# Patient Record
Sex: Female | Born: 1980
Health system: Southern US, Community
[De-identification: ages and names within clinical notes are randomized; demographics above are authoritative.]

## PROBLEM LIST (undated history)

## (undated) DIAGNOSIS — F419 Anxiety disorder, unspecified: Secondary | ICD-10-CM

## (undated) DIAGNOSIS — E282 Polycystic ovarian syndrome: Secondary | ICD-10-CM

## (undated) DIAGNOSIS — G4730 Sleep apnea, unspecified: Secondary | ICD-10-CM

## (undated) DIAGNOSIS — K219 Gastro-esophageal reflux disease without esophagitis: Secondary | ICD-10-CM

## (undated) DIAGNOSIS — F329 Major depressive disorder, single episode, unspecified: Secondary | ICD-10-CM

## (undated) DIAGNOSIS — R7301 Impaired fasting glucose: Secondary | ICD-10-CM

## (undated) DIAGNOSIS — E559 Vitamin D deficiency, unspecified: Secondary | ICD-10-CM

## (undated) DIAGNOSIS — K5900 Constipation, unspecified: Secondary | ICD-10-CM

## (undated) DIAGNOSIS — F32A Depression, unspecified: Secondary | ICD-10-CM

## (undated) DIAGNOSIS — K589 Irritable bowel syndrome without diarrhea: Secondary | ICD-10-CM

## (undated) DIAGNOSIS — R7303 Prediabetes: Secondary | ICD-10-CM

## (undated) HISTORY — DX: Polycystic ovarian syndrome: E28.2

## (undated) HISTORY — DX: Prediabetes: R73.03

## (undated) HISTORY — PX: NOSE SURGERY: SHX723

## (undated) HISTORY — PX: OTHER SURGICAL HISTORY: SHX169

## (undated) HISTORY — DX: Vitamin D deficiency, unspecified: E55.9

## (undated) HISTORY — DX: Major depressive disorder, single episode, unspecified: F32.9

## (undated) HISTORY — DX: Anxiety disorder, unspecified: F41.9

## (undated) HISTORY — DX: Sleep apnea, unspecified: G47.30

## (undated) HISTORY — DX: Irritable bowel syndrome, unspecified: K58.9

## (undated) HISTORY — DX: Constipation, unspecified: K59.00

## (undated) HISTORY — DX: Depression, unspecified: F32.A

## (undated) HISTORY — DX: Impaired fasting glucose: R73.01

---

## 2001-03-07 ENCOUNTER — Encounter: Payer: Self-pay | Admitting: Family Medicine

## 2005-11-22 ENCOUNTER — Other Ambulatory Visit: Admission: RE | Admit: 2005-11-22 | Discharge: 2005-11-22 | Payer: Self-pay | Admitting: Obstetrics & Gynecology

## 2008-10-16 ENCOUNTER — Ambulatory Visit: Payer: Self-pay | Admitting: Family Medicine

## 2008-12-12 ENCOUNTER — Ambulatory Visit: Payer: Self-pay | Admitting: Family Medicine

## 2009-05-25 ENCOUNTER — Ambulatory Visit: Payer: Self-pay | Admitting: Family Medicine

## 2009-05-25 DIAGNOSIS — K589 Irritable bowel syndrome without diarrhea: Secondary | ICD-10-CM | POA: Insufficient documentation

## 2009-06-26 ENCOUNTER — Ambulatory Visit: Payer: Self-pay | Admitting: Family Medicine

## 2009-06-26 DIAGNOSIS — F988 Other specified behavioral and emotional disorders with onset usually occurring in childhood and adolescence: Secondary | ICD-10-CM | POA: Insufficient documentation

## 2009-07-02 ENCOUNTER — Telehealth: Payer: Self-pay | Admitting: Family Medicine

## 2009-07-16 ENCOUNTER — Telehealth (INDEPENDENT_AMBULATORY_CARE_PROVIDER_SITE_OTHER): Payer: Self-pay | Admitting: *Deleted

## 2009-07-20 ENCOUNTER — Ambulatory Visit: Payer: Self-pay | Admitting: Family Medicine

## 2009-07-20 DIAGNOSIS — M546 Pain in thoracic spine: Secondary | ICD-10-CM | POA: Insufficient documentation

## 2009-07-20 DIAGNOSIS — R7301 Impaired fasting glucose: Secondary | ICD-10-CM | POA: Insufficient documentation

## 2009-09-04 ENCOUNTER — Ambulatory Visit: Payer: Self-pay | Admitting: Family Medicine

## 2009-09-04 ENCOUNTER — Telehealth: Payer: Self-pay | Admitting: Family Medicine

## 2009-09-07 ENCOUNTER — Telehealth (INDEPENDENT_AMBULATORY_CARE_PROVIDER_SITE_OTHER): Payer: Self-pay | Admitting: *Deleted

## 2009-09-10 ENCOUNTER — Encounter: Payer: Self-pay | Admitting: Family Medicine

## 2009-09-10 ENCOUNTER — Telehealth (INDEPENDENT_AMBULATORY_CARE_PROVIDER_SITE_OTHER): Payer: Self-pay | Admitting: *Deleted

## 2009-09-17 ENCOUNTER — Ambulatory Visit: Payer: Self-pay | Admitting: Family Medicine

## 2009-10-22 ENCOUNTER — Encounter (INDEPENDENT_AMBULATORY_CARE_PROVIDER_SITE_OTHER): Payer: Self-pay | Admitting: *Deleted

## 2009-10-22 ENCOUNTER — Ambulatory Visit: Payer: Self-pay | Admitting: Family Medicine

## 2009-10-22 DIAGNOSIS — F341 Dysthymic disorder: Secondary | ICD-10-CM | POA: Insufficient documentation

## 2009-10-27 ENCOUNTER — Telehealth (INDEPENDENT_AMBULATORY_CARE_PROVIDER_SITE_OTHER): Payer: Self-pay | Admitting: *Deleted

## 2010-02-03 ENCOUNTER — Ambulatory Visit: Payer: Self-pay | Admitting: Family Medicine

## 2010-02-03 ENCOUNTER — Encounter: Admission: RE | Admit: 2010-02-03 | Discharge: 2010-02-03 | Payer: Self-pay | Admitting: Family Medicine

## 2010-02-03 DIAGNOSIS — M25569 Pain in unspecified knee: Secondary | ICD-10-CM | POA: Insufficient documentation

## 2010-03-15 ENCOUNTER — Ambulatory Visit: Payer: Self-pay | Admitting: Family Medicine

## 2010-03-15 DIAGNOSIS — J029 Acute pharyngitis, unspecified: Secondary | ICD-10-CM | POA: Insufficient documentation
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
## 2010-11-30 NOTE — Assessment & Plan Note (Signed)
Summary: exudative pharyngitis   Vital Signs:  Patient profile:   30 year old female Height:      64 inches Weight:      182 pounds BMI:     31.35 O2 Sat:      100 % on Room air Temp:     97.Taylor degrees F oral Pulse rate:   116 / minute BP sitting:   130 / 80  (left arm) Cuff size:   large  Vitals Entered By: Payton Spark CMA (Mar 15, 2010 2:59 PM)  O2 Flow:  Room air CC: ST x 3 days.   Primary Care Provider:  Seymour Bars DO  CC:  ST x 3 days.Marland Kitchen  History of Present Illness: 30 yo [name] presents for sore throat and bodyaches that started 3 days ago with HA and postnasal drip.  She has a dry cough.  No rash.  Has had a fever at home.  Taking Tylenol.   Feels really tired.    No known exposure to strep or mono.  Using chlorasceptic. Denies hoarsness, nausea or vomitting.  Able to swallow water and saliva, with pain.    Current Medications (verified): 1)  Flintstones Complete  Chew (Pediatric Multivit-Minerals-C) .Marland Kitchen.. 1 Tab By Mouth Once Daily 2)  Seasonale 0.15-0.03 Mg Tabs (Levonorgest-Eth Estrad 91-Day) .... Take 1 Tablet By Mouth Once A Day 3)  D 1000 Plus Aloe  Tabs (Fa-Cyanocobalamin-B6-D-Ca-Aloe) .... Take 1 Tablet By Mouth Once A Day 4)  Bentyl 20 Mg Tabs (Dicyclomine Hcl) .Marland Kitchen.. 1 Tab By Mouth Qid As Needed Abdominal Spasm 5)  Clonazepam 1 Mg Tabs (Clonazepam) .Marland Kitchen.. 1 Tab By Mouth At Bedtime As Needed Anxiety  Allergies (verified): 1)  ! Erythromycin Base (Erythromycin Base)  Past History:  Past Medical History: Reviewed history from 10/22/2009 and no changes required. Impaired fasting glucose depression/ anxiety  Social History: Reviewed history from 05/25/2009 and no changes required. Divorced, lives with boyfriend.  no kids. 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  Review of Systems      See HPI  Physical Exam  General:  alert, well-developed, well-nourished, well-hydrated, and overweight-appearing.    Head:  normocephalic and atraumatic.  sinuses NTTP Eyes:  conjunctiva clear Ears:  EACs patent; TMs translucent and gray with good cone of light and bony landmarks.  Nose:  no nasal discharge.   Mouth:  good dentition, no gingival abnormalities, and pharynx pink and moist.  o/p injected.  voice slightly muffled.  R exudate.  1+ tonsilar hypertrophy Neck:  shotty tender submandibular LA Lungs:  Normal respiratory effort, chest expands symmetrically. Lungs are clear to auscultation, no crackles or wheezes. Heart:  no murmur and tachycardia.   Skin:  color normal and no rashes.     Impression & Recommendations:  Problem # 1:  EXUDATIVE PHARYNGITIS (ICD-462)  Rapid strep negative today but clinically, she appears to have strep (or possibly EBV).  Will treat with Amoxicillin + supportive care.  Call if rash develops.   Her updated medication list for this problem includes:    Amoxicillin 500 Mg Caps (Amoxicillin) .Marland Kitchen... 1 capsule by mouth three times a day x 7 days  Orders: Rapid Strep ([address])  Complete Medication List: 1)  Flintstones Complete Chew (Pediatric multivit-minerals-c) .Marland Kitchen.. 1 tab by mouth once daily 2)  Seasonale 0.15-0.03 Mg Tabs (Levonorgest-eth estrad 91-day) .... Take 1 tablet by mouth once a day 3)  D 1000 Plus Aloe Tabs (Fa-cyanocobalamin-b6-d-ca-aloe) .... Take 1  tablet by mouth once a day 4)  Bentyl 20 Mg Tabs (Dicyclomine hcl) .Marland Kitchen.. 1 tab by mouth qid as needed abdominal spasm 5)  Clonazepam 1 Mg Tabs (Clonazepam) .Marland Kitchen.. 1 tab by mouth at bedtime as needed anxiety 6)  Amoxicillin 500 Mg Caps (Amoxicillin) .Marland Kitchen.. 1 capsule by mouth three times a day x 7 days  Patient Instructions: 1)  Take Amoxicillin x 7 days for presumed strep throat. 2)  Use throat lozenges and ibuprofen for comfort. 3)  Clear liquids, rest. 4)  Call if not better in 1 wk or rash develops. Prescriptions: AMOXICILLIN 500 MG CAPS (AMOXICILLIN) 1 capsule by mouth three times a day x 7 days  #21 x 0    Entered and Authorized by:   Seymour Bars DO   Signed by:   Seymour Bars DO on 03/15/2010   Method used:   [facility]        [facility] [phone]* (retail)       [address] [place], [place]  [address]       Ph: [phone] or [phone]       Fax: [phone]   RxID:   [id number]   Laboratory Results    Other Tests  Rapid Strep: negative

## 2011-01-17 ENCOUNTER — Encounter: Payer: Self-pay | Admitting: Family Medicine

## 2011-01-19 ENCOUNTER — Ambulatory Visit (INDEPENDENT_AMBULATORY_CARE_PROVIDER_SITE_OTHER): Payer: PRIVATE HEALTH INSURANCE | Admitting: Family Medicine

## 2011-01-19 ENCOUNTER — Encounter: Payer: Self-pay | Admitting: Family Medicine

## 2011-01-19 DIAGNOSIS — F988 Other specified behavioral and emotional disorders with onset usually occurring in childhood and adolescence: Secondary | ICD-10-CM

## 2011-01-19 DIAGNOSIS — K589 Irritable bowel syndrome without diarrhea: Secondary | ICD-10-CM

## 2011-01-19 DIAGNOSIS — F411 Generalized anxiety disorder: Secondary | ICD-10-CM

## 2011-01-19 MED ORDER — DICYCLOMINE HCL 20 MG PO TABS: 20.0000 mg | ORAL_TABLET | Freq: Four times a day (QID) | ORAL | Status: DC | PRN

## 2011-01-19 MED ORDER — LISDEXAMFETAMINE DIMESYLATE 20 MG PO CAPS: 20.0000 mg | ORAL_CAPSULE | ORAL | Status: DC

## 2011-01-19 MED ORDER — CLONAZEPAM 1 MG PO TABS: 1.0000 mg | ORAL_TABLET | Freq: Every evening | ORAL | Status: DC | PRN

## 2011-01-19 NOTE — Assessment & Plan Note (Signed)
She is happy with the effectiveness of Vyvanse which she is using only on the days that she has to study for tests.  No palpations, insomnia, palpitations or anorexia.  She is due for a RD.

## 2011-01-19 NOTE — Progress Notes (Signed)
  Subjective:    Patient ID: Taylor [name], female    DOB: [birth year], 30 y.o.   MRN: [id number]  HPI30 yo WF presents for f/u ADD.  She is doing well on Vyvanse 20 mg once daily.  She only needs them on the days that she has to study for exams.  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  She denies palpitations, tremors, insomnia or anorexia.  She is on Seasonale for birth control.  She needs RFs of Bentyl and Klonopin.     Review of Systems  As per HPI.     Objective:   Physical Exam  Constitutional: She appears well-developed and well-nourished. No distress.       obese  HENT:  Head: Normocephalic and atraumatic.  Neck: Normal range of motion. Neck supple. No thyromegaly present.  Cardiovascular: Normal rate, regular rhythm and normal heart sounds.   No murmur heard. Pulmonary/Chest: Effort normal and breath sounds normal. No respiratory distress.  Neurological:       No tremor          Assessment & Plan:

## 2011-01-19 NOTE — Patient Instructions (Signed)
Stay on Vyvanse for ADD.  Return for follow up in 6 months.

## 2011-09-09 ENCOUNTER — Other Ambulatory Visit: Payer: Self-pay | Admitting: *Deleted

## 2011-09-09 DIAGNOSIS — F411 Generalized anxiety disorder: Secondary | ICD-10-CM

## 2011-09-09 MED ORDER — CLONAZEPAM 1 MG PO TABS: 1.0000 mg | ORAL_TABLET | Freq: Every evening | ORAL | Status: DC | PRN

## 2011-09-19 ENCOUNTER — Encounter: Payer: Self-pay | Admitting: Family Medicine

## 2011-09-19 ENCOUNTER — Ambulatory Visit (INDEPENDENT_AMBULATORY_CARE_PROVIDER_SITE_OTHER): Payer: PRIVATE HEALTH INSURANCE | Admitting: Family Medicine

## 2011-09-19 VITALS — BP 113/65 | HR 92 | Wt 197.0 lb

## 2011-09-19 DIAGNOSIS — F411 Generalized anxiety disorder: Secondary | ICD-10-CM

## 2011-09-19 MED ORDER — SERTRALINE HCL 50 MG PO TABS: 50.0000 mg | ORAL_TABLET | Freq: Every day | ORAL | Status: DC

## 2011-09-19 MED ORDER — CLONAZEPAM 1 MG PO TABS: 1.0000 mg | ORAL_TABLET | Freq: Every evening | ORAL | Status: DC | PRN

## 2011-09-19 NOTE — Progress Notes (Signed)
  Subjective:    Patient ID: Taylor [name], female    DOB: [birth year], 30 y.o.   MRN: [id number]  HPI She has had a family crisis about 3 weeks ago. She is Consulting civil engineer at [employer] and started seeing a therapist throught school and is starting biofeedbck.  Just found out father was cheating on her mom for the past 20 years.  Has taken zoloft, celexa, and cymbalta.  She thinks did ok with zoloft. Also really liked the cymbalta but can't afford it. She is also in school and finals are coming up so this has been stressful as well    Review of Systems     Objective:   Physical Exam  Constitutional: She is oriented to person, place, and time. She appears well-developed and well-nourished.  HENT:  Head: Normocephalic and atraumatic.  Neurological: She is alert and oriented to person, place, and time.  Skin: Skin is warm and dry.  Psychiatric: She has a normal mood and affect. Her behavior is normal.          Assessment & Plan:  GAD- 7 score of 17 today. Also some acute situational depression. Will restart the zoloft and f/u in 4 weeks. Has done well on it in the past. Half a tab dialy for 1-2 weeks then increase to whole tab. Continue counseling. Call if any concerns with the medication. Also refilled her xanax. We had sent 10 days ago but pharm says they never got it.

## 2011-10-13 ENCOUNTER — Encounter: Payer: Self-pay | Admitting: Family Medicine

## 2011-10-17 ENCOUNTER — Encounter: Payer: Self-pay | Admitting: Family Medicine

## 2011-10-17 ENCOUNTER — Ambulatory Visit (INDEPENDENT_AMBULATORY_CARE_PROVIDER_SITE_OTHER): Payer: PRIVATE HEALTH INSURANCE | Admitting: Family Medicine

## 2011-10-17 VITALS — BP 114/73 | HR 89 | Wt 197.0 lb

## 2011-10-17 DIAGNOSIS — F419 Anxiety disorder, unspecified: Secondary | ICD-10-CM

## 2011-10-17 DIAGNOSIS — F411 Generalized anxiety disorder: Secondary | ICD-10-CM

## 2011-10-17 MED ORDER — SERTRALINE HCL 50 MG PO TABS: 50.0000 mg | ORAL_TABLET | Freq: Every day | ORAL | Status: DC

## 2011-10-17 NOTE — Progress Notes (Signed)
  Subjective:    Patient ID: Taylor [name], female    DOB: 04-15-1981, 30 y.o.   MRN: [id number]  HPI F/U anxiety. 4 weeks started sertraline 50mg  and was getting HA and nausea so she cut in in half.  Feel her anxiety is much better.  She moved it to bedtime and says it is working some better but taking at bedtime versus in the morning. She is doing biofeedback as well. Her first appointment was last week.   Review of Systems     Objective:   Physical Exam  Constitutional: She is oriented to person, place, and time. She appears well-developed and well-nourished.  HENT:  Head: Normocephalic and atraumatic.  Cardiovascular: Normal rate, regular rhythm and normal heart sounds.   Pulmonary/Chest: Effort normal and breath sounds normal.  Neurological: She is alert and oriented to person, place, and time.  Skin: Skin is warm and dry.  Psychiatric: She has a normal mood and affect. Her behavior is normal.          Assessment & Plan:  Anxiety - GAD-7 score of 13 today, down from 17.  See seeing psychology Gertha Calkin. Work on inc dose to 50mg  and f/u in 6 weeks.  If she does not tolerate 50 mg a day Bactrim and 25 until I see her back in 6 weeks. I do think that she would have a more therapeutic effect at the 50 mg dose. She is still very happy with the progress she has made.

## 2011-11-28 ENCOUNTER — Ambulatory Visit: Payer: PRIVATE HEALTH INSURANCE | Admitting: Family Medicine

## 2011-12-01 ENCOUNTER — Ambulatory Visit: Payer: PRIVATE HEALTH INSURANCE | Admitting: Family Medicine

## 2011-12-02 ENCOUNTER — Ambulatory Visit: Payer: PRIVATE HEALTH INSURANCE | Admitting: Family Medicine

## 2011-12-02 ENCOUNTER — Encounter: Payer: Self-pay | Admitting: Family Medicine

## 2011-12-02 ENCOUNTER — Ambulatory Visit (INDEPENDENT_AMBULATORY_CARE_PROVIDER_SITE_OTHER): Payer: PRIVATE HEALTH INSURANCE | Admitting: Family Medicine

## 2011-12-02 VITALS — BP 123/73 | HR 91 | Temp 98.3°F | Wt 202.0 lb

## 2011-12-02 DIAGNOSIS — G4700 Insomnia, unspecified: Secondary | ICD-10-CM

## 2011-12-02 DIAGNOSIS — Z23 Encounter for immunization: Secondary | ICD-10-CM

## 2011-12-02 DIAGNOSIS — F341 Dysthymic disorder: Secondary | ICD-10-CM

## 2011-12-02 DIAGNOSIS — F32A Depression, unspecified: Secondary | ICD-10-CM

## 2011-12-02 DIAGNOSIS — F329 Major depressive disorder, single episode, unspecified: Secondary | ICD-10-CM

## 2011-12-02 MED ORDER — ZOLPIDEM TARTRATE 10 MG PO TABS: 10.0000 mg | ORAL_TABLET | Freq: Every evening | ORAL | Status: AC | PRN

## 2011-12-02 NOTE — Progress Notes (Signed)
  Subjective:    Patient ID: Taylor [name], female    DOB: 11/24/1980, 31 y.o.   MRN: [id number]  HPI  Anxiety/Depression - doing well on the higher 50mg  dose.  Still feels exhausted but felt that way before started the med. Feels it is really helping her. Happy with current does. Denis any SE.  Felt her sleep improved initially but now back to the same.   Hx of long term insomnia. She says she often has trouble initiating and maintaining sleep. Often times she will wake up at night and a complete hours for her to go back to sleep. She says very rarely will she asked to have a good night sleep. Says this is not better even with her anxiety under better control. Says feels tired all the time. No caffeine after lunch.  Bedroom is dark, cool, quiet. No TV in bedroom.  Has tried unisom, melatonin, and some OTc Meds. None of these really work.  Has used ambien in the past and it was helpful but sometime made her feel groggy. No alleviating or exacerbating sxs.   Review of Systems     Objective:   Physical Exam  Constitutional: She is oriented to person, place, and time. She appears well-developed and well-nourished.  HENT:  Head: Normocephalic and atraumatic.  Cardiovascular: Normal rate, regular rhythm and normal heart sounds.   Pulmonary/Chest: Effort normal and breath sounds normal.  Neurological: She is alert and oriented to person, place, and time.  Skin: Skin is warm and dry.  Psychiatric: She has a normal mood and affect. Her behavior is normal.          Assessment & Plan:  Anxiety/depression -GAD- 7 score of 1.  Takes med at night. Doing really well.  Continue regimen. F/U in 3 months.   Insomnia -New problems.  She plans on starting exercise program whic should help.  Can consider a sleep aid.  We discussed retrying the Ambien since she has used this in the past. Certainly she could split it in half and see if it improves her sleep quality but doesn't cause excess sedation in the  morning. She says she will try that. I also warned that this should really be used for short-term use, x 2 weeks,  and to try to wean as quickly as possible and use it as needed.  Tdap given today.

## 2011-12-10 ENCOUNTER — Other Ambulatory Visit: Payer: Self-pay | Admitting: Family Medicine

## 2012-03-12 ENCOUNTER — Encounter: Payer: Self-pay | Admitting: Family Medicine

## 2012-03-12 ENCOUNTER — Ambulatory Visit (INDEPENDENT_AMBULATORY_CARE_PROVIDER_SITE_OTHER): Payer: PRIVATE HEALTH INSURANCE | Admitting: Family Medicine

## 2012-03-12 VITALS — BP 128/81 | HR 65 | Ht 64.0 in | Wt 196.0 lb

## 2012-03-12 DIAGNOSIS — F17200 Nicotine dependence, unspecified, uncomplicated: Secondary | ICD-10-CM

## 2012-03-12 DIAGNOSIS — R5381 Other malaise: Secondary | ICD-10-CM

## 2012-03-12 DIAGNOSIS — F418 Other specified anxiety disorders: Secondary | ICD-10-CM

## 2012-03-12 DIAGNOSIS — E611 Iron deficiency: Secondary | ICD-10-CM | POA: Insufficient documentation

## 2012-03-12 DIAGNOSIS — F341 Dysthymic disorder: Secondary | ICD-10-CM

## 2012-03-12 DIAGNOSIS — R5383 Other fatigue: Secondary | ICD-10-CM

## 2012-03-12 DIAGNOSIS — Z720 Tobacco use: Secondary | ICD-10-CM

## 2012-03-12 MED ORDER — SERTRALINE HCL 100 MG PO TABS: 100.0000 mg | ORAL_TABLET | Freq: Every day | ORAL | Status: DC

## 2012-03-12 NOTE — Progress Notes (Signed)
  Subjective:    Patient ID: Taylor [name], female    DOB: 02-Nov-1980, 31 y.o.   MRN: [id number]  HPI 1 months of worsening anxiety and depression. Feels tired all the time and feels like she could fall alseep anywhere.  However, she has dry mouth.  Working out 2x /week on treadmill. Diet varies.  Appetite up and down.  Smoking 1/2ppd, somedays doesn't smoke at all. Drinking 2 glasses of wine 2 days per week. No CP or SOB.  No suicidal ideation or hallucinations. Occ racing thoughts.  Has had exams over the last 2 weeks. Uses her klonopin once a week on average. Sleep quality if fair. She says she feels very tired, similar to right before she started the medication. She has a good support system.     Review of Systems     Objective:   Physical Exam  Constitutional: She is oriented to person, place, and time. She appears well-developed and well-nourished.  HENT:  Head: Normocephalic and atraumatic.  Cardiovascular: Normal rate, regular rhythm and normal heart sounds.   Pulmonary/Chest: Effort normal and breath sounds normal.  Neurological: She is alert and oriented to person, place, and time.  Skin: Skin is warm and dry.  Psychiatric: She has a normal mood and affect. Her behavior is normal.          Assessment & Plan:  Depression with anxiety. PHQ-9  Score of 19.  GAD-7 score 8 (moderate).  Uncontrolled. Discussed increase her sertraline to 100mg . Monitor for worsening dry mouth.   F/U in 2-3 months.   Fatigue - She wanted to hold off on labwork for now as she is seeing her gyn next month and will get labs then. I did ask her to have her gyn screen her lipids.    Tob Abuse - Says feel mentally she is not ready until school is over.  She knows needs to quit.

## 2012-03-12 NOTE — Patient Instructions (Signed)
Please have your gyn check your cholesterol.

## 2012-04-04 IMAGING — CR DG KNEE COMPLETE 4+V*R*
5 series · 5 of 5 positions shown · non-contrast
Comparison: None

CLINICAL DATA: Right knee pain.

RIGHT KNEE - COMPLETE 4+ VIEW

[view not recorded (1 of 5)]
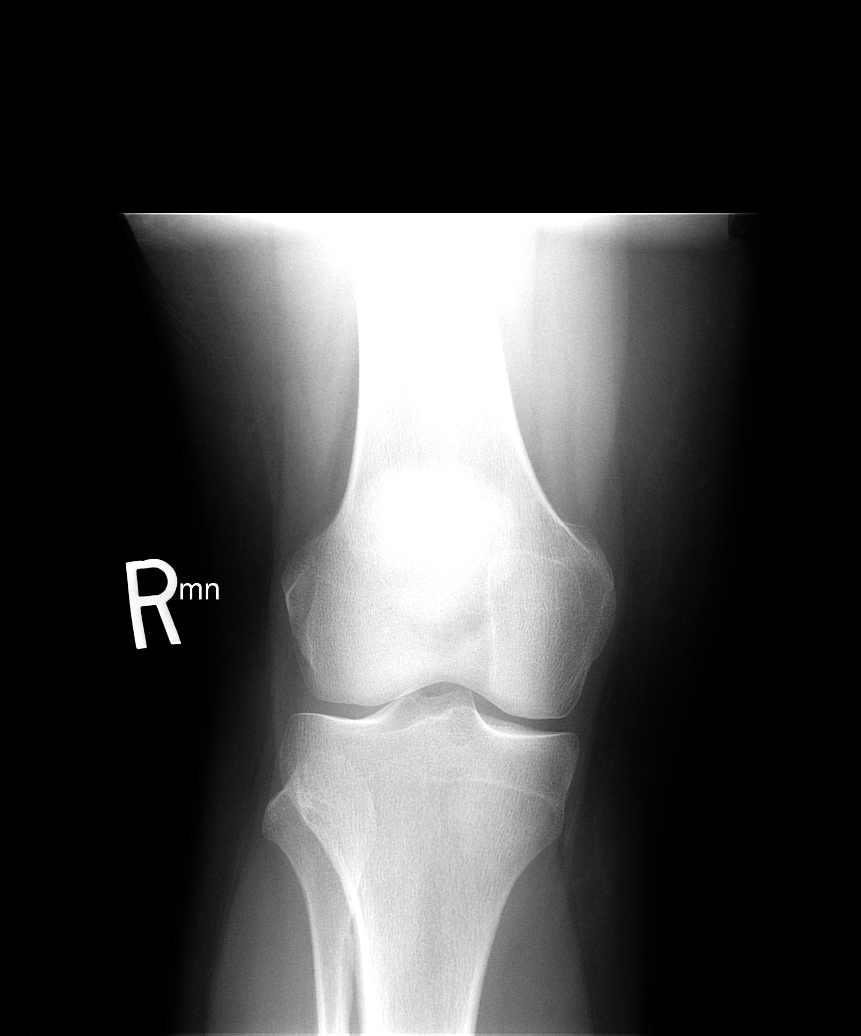

[view not recorded (2 of 5)]
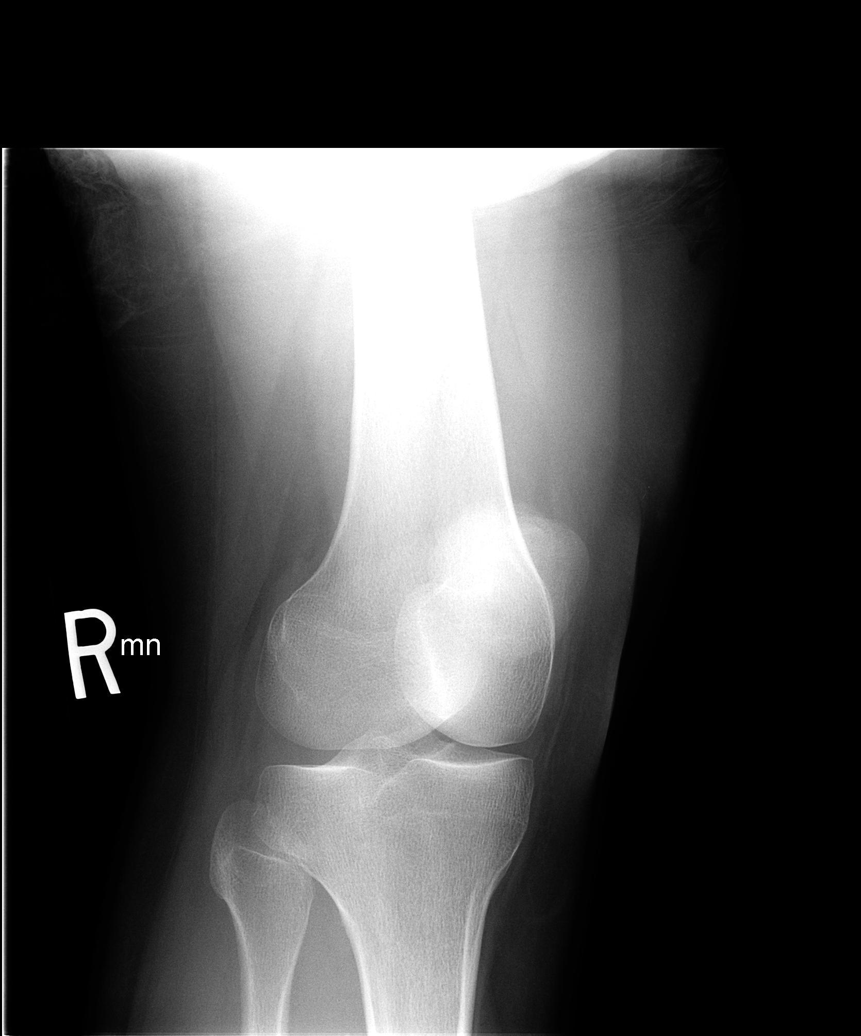

[view not recorded (3 of 5)]
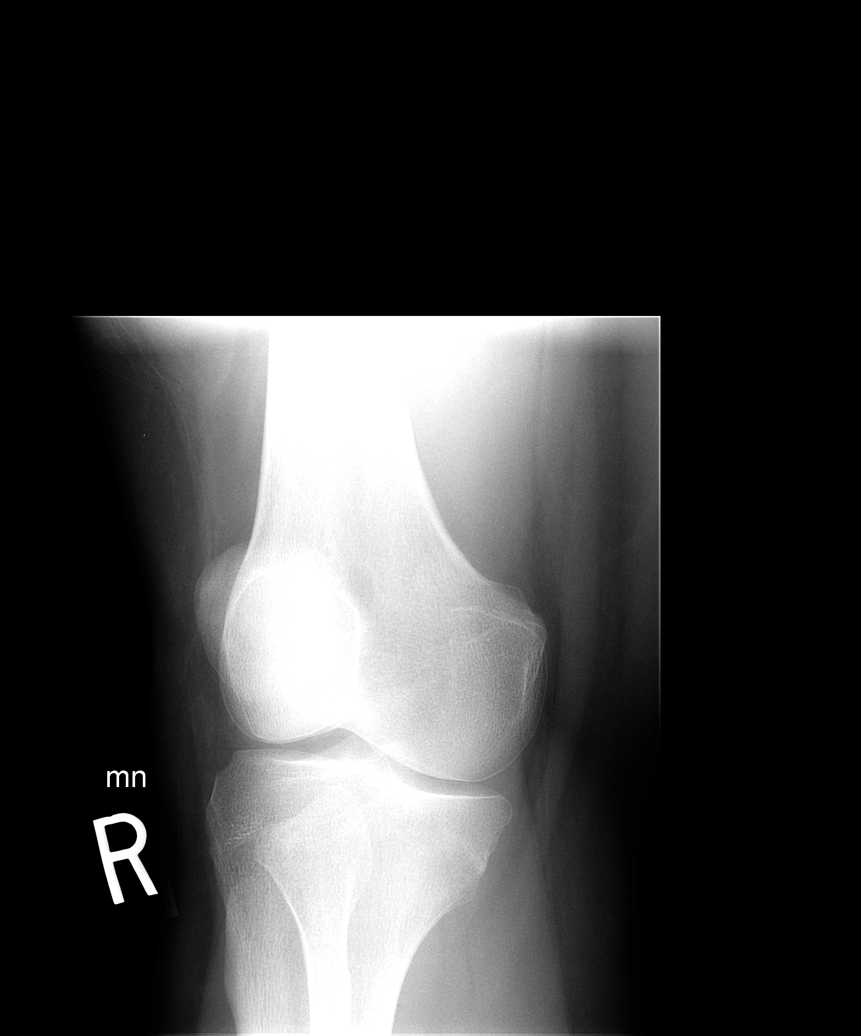

[view not recorded (4 of 5)]
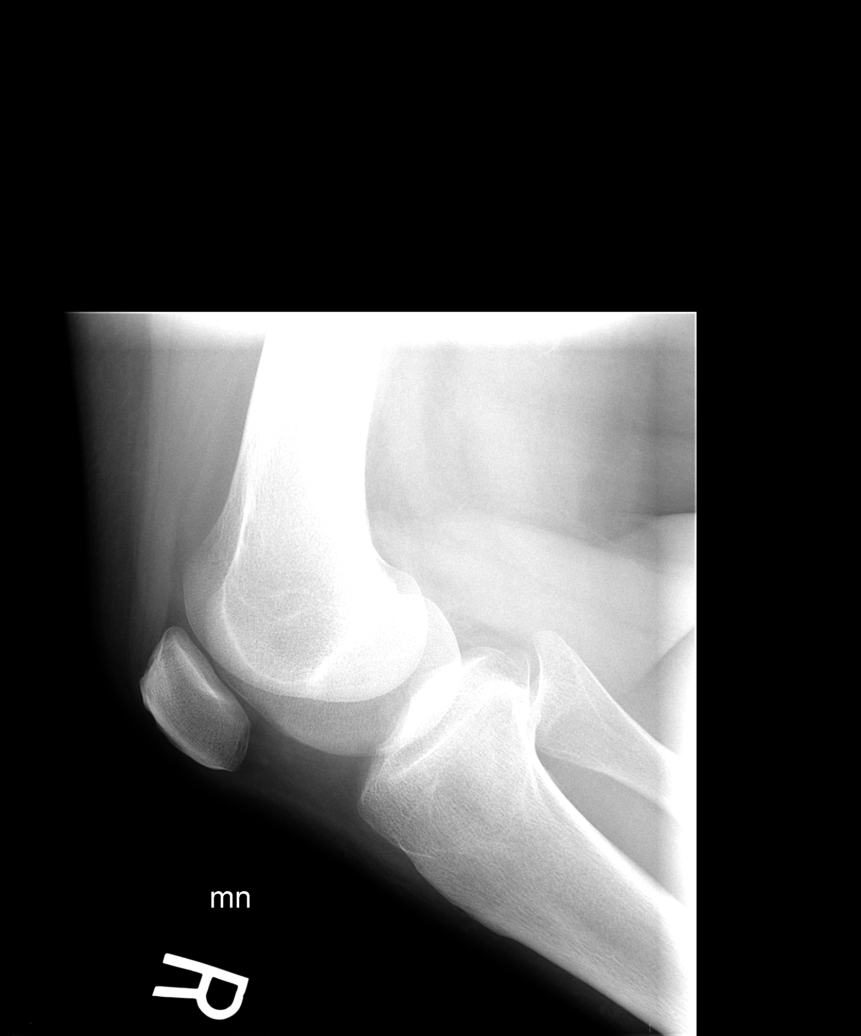

[view not recorded (5 of 5)]
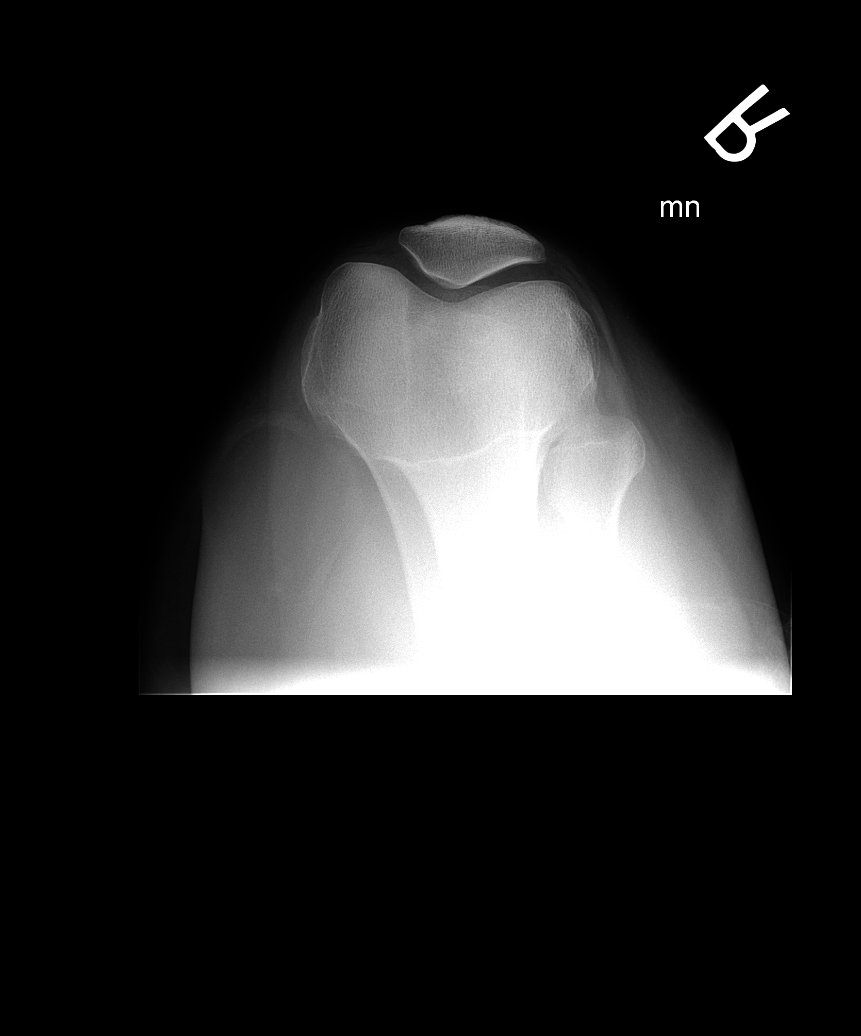

[5 of 5 positions shown; findings below may reference images not displayed]

FINDINGS: The joint spaces are maintained.  No fractures are seen.
No osteochondral lesions.  No joint effusion.
IMPRESSION: No acute bony findings or degenerative changes.

## 2012-04-24 ENCOUNTER — Other Ambulatory Visit: Payer: Self-pay | Admitting: Family Medicine

## 2012-05-15 ENCOUNTER — Ambulatory Visit: Payer: PRIVATE HEALTH INSURANCE | Admitting: Family Medicine

## 2012-09-29 ENCOUNTER — Other Ambulatory Visit: Payer: Self-pay | Admitting: Family Medicine

## 2012-10-04 ENCOUNTER — Other Ambulatory Visit: Payer: Self-pay | Admitting: *Deleted

## 2012-10-04 ENCOUNTER — Ambulatory Visit (INDEPENDENT_AMBULATORY_CARE_PROVIDER_SITE_OTHER): Payer: BC Managed Care – PPO | Admitting: Family Medicine

## 2012-10-04 ENCOUNTER — Encounter: Payer: Self-pay | Admitting: Family Medicine

## 2012-10-04 VITALS — BP 123/81 | HR 87 | Ht 64.0 in | Wt 207.0 lb

## 2012-10-04 DIAGNOSIS — J029 Acute pharyngitis, unspecified: Secondary | ICD-10-CM

## 2012-10-04 DIAGNOSIS — F988 Other specified behavioral and emotional disorders with onset usually occurring in childhood and adolescence: Secondary | ICD-10-CM

## 2012-10-04 DIAGNOSIS — F411 Generalized anxiety disorder: Secondary | ICD-10-CM

## 2012-10-04 DIAGNOSIS — G4700 Insomnia, unspecified: Secondary | ICD-10-CM

## 2012-10-04 LAB — POCT RAPID STREP A (OFFICE): Rapid Strep A Screen: NEGATIVE

## 2012-10-04 MED ORDER — FLUTICASONE PROPIONATE 50 MCG/ACT NA SUSP: 2.0000 | Freq: Every day | NASAL | Status: DC

## 2012-10-04 MED ORDER — CLONAZEPAM 1 MG PO TABS: 1.0000 mg | ORAL_TABLET | Freq: Every evening | ORAL | Status: DC | PRN

## 2012-10-04 MED ORDER — SERTRALINE HCL 100 MG PO TABS: 150.0000 mg | ORAL_TABLET | Freq: Every day | ORAL | Status: DC

## 2012-10-04 MED ORDER — LISDEXAMFETAMINE DIMESYLATE 20 MG PO CAPS: 20.0000 mg | ORAL_CAPSULE | ORAL | Status: DC
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
## 2012-12-06 ENCOUNTER — Ambulatory Visit: Payer: BC Managed Care – PPO | Admitting: Family Medicine

## 2012-12-06 DIAGNOSIS — Z0289 Encounter for other administrative examinations: Secondary | ICD-10-CM

## 2013-03-26 ENCOUNTER — Other Ambulatory Visit: Payer: Self-pay | Admitting: *Deleted

## 2013-03-27 ENCOUNTER — Other Ambulatory Visit: Payer: Self-pay | Admitting: Family Medicine

## 2013-03-27 NOTE — Telephone Encounter (Signed)
Needs appointment

## 2013-05-28 ENCOUNTER — Other Ambulatory Visit: Payer: Self-pay | Admitting: Family Medicine

## 2013-09-20 ENCOUNTER — Encounter: Payer: Self-pay | Admitting: Family Medicine

## 2013-09-20 ENCOUNTER — Ambulatory Visit (INDEPENDENT_AMBULATORY_CARE_PROVIDER_SITE_OTHER): Payer: BC Managed Care – PPO | Admitting: Family Medicine

## 2013-09-20 VITALS — BP 131/91 | HR 95 | Wt 204.0 lb

## 2013-09-20 DIAGNOSIS — F411 Generalized anxiety disorder: Secondary | ICD-10-CM

## 2013-09-20 DIAGNOSIS — F4323 Adjustment disorder with mixed anxiety and depressed mood: Secondary | ICD-10-CM

## 2013-09-20 DIAGNOSIS — E282 Polycystic ovarian syndrome: Secondary | ICD-10-CM | POA: Insufficient documentation

## 2013-09-20 DIAGNOSIS — R7301 Impaired fasting glucose: Secondary | ICD-10-CM

## 2013-09-20 DIAGNOSIS — F988 Other specified behavioral and emotional disorders with onset usually occurring in childhood and adolescence: Secondary | ICD-10-CM

## 2013-09-20 DIAGNOSIS — J309 Allergic rhinitis, unspecified: Secondary | ICD-10-CM

## 2013-09-20 MED ORDER — LEVOCETIRIZINE DIHYDROCHLORIDE 5 MG PO TABS: 5.0000 mg | ORAL_TABLET | Freq: Every evening | ORAL | Status: DC

## 2013-09-20 MED ORDER — BECLOMETHASONE DIPROPIONATE 80 MCG/ACT NA AERS: 2.0000 | INHALATION_SPRAY | Freq: Every day | NASAL | Status: DC

## 2013-09-20 MED ORDER — LISDEXAMFETAMINE DIMESYLATE 20 MG PO CAPS: 20.0000 mg | ORAL_CAPSULE | ORAL | Status: DC

## 2013-09-20 MED ORDER — CLONAZEPAM 1 MG PO TABS: 1.0000 mg | ORAL_TABLET | Freq: Every evening | ORAL | Status: DC | PRN

## 2013-09-20 NOTE — Progress Notes (Signed)
  Subjective:    Patient ID: Taylor [name], female    DOB: [birth year], 32 y.o.   MRN: [id number]  HPI Here to followup on mood, anxiety/depression-she's previously on Zoloft. Was last prescribed in the spring. Her OB/GYN has been prescribing her Wellbutrin. Initially was added to her Zoloft but then she weaned off the Zoloft and has been doing well on Wellbutrin by itself.  ADD- wants refill on vyvanse today. Has been out of meds for almost a year.  No CP, SOB, or palpitations. No sleep issues or weight loss on the medicaiton.   Impaired fasting glucose/PCOS - Being followed by her gyn.  On metformin now.    AR - Using claritin -D.  On flonase.  Has tried zyrtec and Allegra and don't seem to work well  Had allergy shots as a child. She does have one cat.  She does have allergy.  Stil having sig congestion.     Review of Systems     Objective:   Physical Exam  Constitutional: She is oriented to person, place, and time. She appears well-developed and well-nourished.  HENT:  Head: Normocephalic and atraumatic.  Cardiovascular: Normal rate, regular rhythm and normal heart sounds.   Pulmonary/Chest: Effort normal and breath sounds normal.  Neurological: She is alert and oriented to person, place, and time.  Skin: Skin is warm and dry.  Psychiatric: She has a normal mood and affect. Her behavior is normal.          Assessment & Plan:  Anxiety/depression-Wellbutrin working well.  Off the zoloft.  She will need to get refills of her Xanax from her OB/GYN who is prescribing her mood medication. Gad 7 score of 5 today and PHQ 9 score of 8.  ADD-refill on the vyvanse. Refills given for 3 months. Followup in 4 months for ADD.  Impaired fasting glucose/PCOS-on metformin now for OB/GYN.  AR - will change to Qnasal instead of flonase.  We'll also try changing her to xyzal instead of Claritin-D. Also consider referral to allergist for immunotherapy. Changing the medication may help a little but  it may not. She also is allergic to cats and has a cat in her home that also sleeps in her bedroom. Encouraged her to consider having the cat sleep outside of the bedroom.

## 2014-01-21 ENCOUNTER — Ambulatory Visit: Payer: BC Managed Care – PPO | Admitting: Family Medicine

## 2014-02-12 ENCOUNTER — Ambulatory Visit: Payer: BC Managed Care – PPO | Admitting: Family Medicine

## 2014-06-02 ENCOUNTER — Ambulatory Visit (INDEPENDENT_AMBULATORY_CARE_PROVIDER_SITE_OTHER): Payer: BLUE CROSS/BLUE SHIELD | Admitting: Family Medicine

## 2014-06-02 ENCOUNTER — Encounter: Payer: Self-pay | Admitting: Family Medicine

## 2014-06-02 VITALS — BP 128/89 | HR 94 | Temp 98.1°F | Ht 64.0 in | Wt 199.8 lb

## 2014-06-02 DIAGNOSIS — J029 Acute pharyngitis, unspecified: Secondary | ICD-10-CM | POA: Diagnosis not present

## 2014-06-02 MED ORDER — PENICILLIN V POTASSIUM 500 MG PO TABS: ORAL_TABLET | ORAL | Status: AC

## 2014-06-02 MED ORDER — MELOXICAM 15 MG PO TABS: 15.0000 mg | ORAL_TABLET | Freq: Every day | ORAL | Status: DC
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
## 2014-07-02 ENCOUNTER — Other Ambulatory Visit: Payer: Self-pay | Admitting: Family Medicine

## 2014-07-22 ENCOUNTER — Ambulatory Visit (INDEPENDENT_AMBULATORY_CARE_PROVIDER_SITE_OTHER): Payer: BLUE CROSS/BLUE SHIELD | Admitting: Family Medicine

## 2014-07-22 ENCOUNTER — Encounter: Payer: Self-pay | Admitting: Family Medicine

## 2014-07-22 VITALS — BP 128/84 | HR 113 | Temp 98.5°F | Wt 205.0 lb

## 2014-07-22 DIAGNOSIS — R059 Cough, unspecified: Secondary | ICD-10-CM | POA: Diagnosis not present

## 2014-07-22 DIAGNOSIS — B9689 Other specified bacterial agents as the cause of diseases classified elsewhere: Secondary | ICD-10-CM | POA: Diagnosis not present

## 2014-07-22 DIAGNOSIS — A499 Bacterial infection, unspecified: Secondary | ICD-10-CM

## 2014-07-22 DIAGNOSIS — R05 Cough: Secondary | ICD-10-CM | POA: Diagnosis not present

## 2014-07-22 DIAGNOSIS — J329 Chronic sinusitis, unspecified: Secondary | ICD-10-CM

## 2014-07-22 MED ORDER — HYDROCODONE-HOMATROPINE 5-1.5 MG/5ML PO SYRP: 5.0000 mL | ORAL_SOLUTION | Freq: Three times a day (TID) | ORAL | Status: DC | PRN

## 2014-07-22 MED ORDER — DOXYCYCLINE HYCLATE 100 MG PO TABS: ORAL_TABLET | ORAL | Status: AC
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
## 2014-08-07 ENCOUNTER — Other Ambulatory Visit: Payer: Self-pay | Admitting: Family Medicine

## 2014-09-30 ENCOUNTER — Other Ambulatory Visit: Payer: Self-pay | Admitting: Family Medicine

## 2014-10-02 DIAGNOSIS — E66811 Obesity, class 1: Secondary | ICD-10-CM | POA: Insufficient documentation

## 2014-10-02 DIAGNOSIS — E669 Obesity, unspecified: Secondary | ICD-10-CM | POA: Insufficient documentation

## 2014-10-20 ENCOUNTER — Ambulatory Visit: Payer: BC Managed Care – PPO | Admitting: Family Medicine

## 2014-11-05 ENCOUNTER — Encounter: Payer: Self-pay | Admitting: Family Medicine

## 2014-11-05 ENCOUNTER — Ambulatory Visit (INDEPENDENT_AMBULATORY_CARE_PROVIDER_SITE_OTHER): Payer: BLUE CROSS/BLUE SHIELD | Admitting: Family Medicine

## 2014-11-05 VITALS — BP 128/77 | HR 93 | Ht 64.0 in | Wt 204.0 lb

## 2014-11-05 DIAGNOSIS — E282 Polycystic ovarian syndrome: Secondary | ICD-10-CM | POA: Diagnosis not present

## 2014-11-05 DIAGNOSIS — F909 Attention-deficit hyperactivity disorder, unspecified type: Secondary | ICD-10-CM

## 2014-11-05 DIAGNOSIS — F411 Generalized anxiety disorder: Secondary | ICD-10-CM

## 2014-11-05 DIAGNOSIS — F988 Other specified behavioral and emotional disorders with onset usually occurring in childhood and adolescence: Secondary | ICD-10-CM

## 2014-11-05 DIAGNOSIS — F341 Dysthymic disorder: Secondary | ICD-10-CM

## 2014-11-05 DIAGNOSIS — Z6835 Body mass index (BMI) 35.0-35.9, adult: Secondary | ICD-10-CM

## 2014-11-05 LAB — POCT GLYCOSYLATED HEMOGLOBIN (HGB A1C): Hemoglobin A1C: 5.3

## 2014-11-05 MED ORDER — BUPROPION HCL ER (XL) 450 MG PO TB24: 300450.0000 mg | ORAL_TABLET | Freq: Every morning | ORAL | Status: DC

## 2014-11-05 MED ORDER — BECLOMETHASONE DIPROPIONATE 80 MCG/ACT NA AERS: 2.0000 | INHALATION_SPRAY | Freq: Every day | NASAL | Status: DC

## 2014-11-05 MED ORDER — LEVOCETIRIZINE DIHYDROCHLORIDE 5 MG PO TABS: ORAL_TABLET | ORAL | Status: DC

## 2014-11-05 MED ORDER — CLONAZEPAM 1 MG PO TABS: 1.0000 mg | ORAL_TABLET | Freq: Every evening | ORAL | Status: DC | PRN

## 2014-11-05 NOTE — Progress Notes (Signed)
   Subjective:    Patient ID: Taylor NearingJillian [name], female    DOB: 02/08/1981, 34 y.o.   MRN: [id number]  HPI F/U anxiety /depression -  Having more depressive sxs. Just lost her job on Monday.  This is been very stressful and she is worried about finding a new job. She does have a meeting with a headhunter on Thursday to start looking. 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 Her last day is Friday. She has been taking the Wellbutrin 3 mg regularly and uses her Klonopin as needed. She does complain when register pleasure in doing things several days of the week as as well as some sleep difficulty over the last week or 2. She denies any thoughts of wanting to harm herself. She does report increased irritability and difficulty relaxing and feeling like she is wondering excessively.  ADD- says trying to manage her ADD herself.  Says the Vyvanse made her feel like she is not herself.  We had last filled this medication about a year ago.  PCOS - taking her metformin regularly. She has been seeing Dr. Cathey EndowBowen. She is on trial of Qsymia.  Doing well on it so far. Says makes her feel spacy sometimes.   Review of Systems     Objective:   Physical Exam  Constitutional: She is oriented to person, place, and time. She appears well-developed and well-nourished.  HENT:  Head: Normocephalic and atraumatic.  Cardiovascular: Normal rate, regular rhythm and normal heart sounds.   Pulmonary/Chest: Effort normal and breath sounds normal.  Neurological: She is alert and oriented to person, place, and time.  Skin: Skin is warm and dry.  Psychiatric: She has a normal mood and affect. Her behavior is normal.          Assessment & Plan:  Anxiety/Depression  - not well controlled, though I feel occur acute situation is part of why she is not doing well. We discussed the option of continuing her current regimen with hopes that she will be of her finding a job soon, versus increasing  her regimen. She opted to increase her regimen.. will inc wellbutrin to 450mg  daily.  Sent a new Rx. Gad 7 score of 7 today. Previous was 5. She rates her symptoms is somewhat difficult. PHQ 9 score of 12 today, previous of 8. Again rates her symptoms is somewhat difficult.  ADD - opting to not takes meds at this time.   PCOS - For now is on  Metformin. Will have a repeat glucose tolerance test done a few weeks to see if still needs to be on it.  Obesity/BMI 35 - work with bariatrician, Dr. Cathey EndowBowen

## 2015-02-05 ENCOUNTER — Other Ambulatory Visit: Payer: Self-pay | Admitting: Family Medicine

## 2015-03-19 ENCOUNTER — Other Ambulatory Visit: Payer: Self-pay | Admitting: Family Medicine

## 2015-03-19 NOTE — Telephone Encounter (Signed)
Needs f/u appt 

## 2015-04-03 ENCOUNTER — Other Ambulatory Visit: Payer: Self-pay | Admitting: *Deleted

## 2015-04-03 MED ORDER — BUPROPION HCL ER (XL) 150 MG PO TB24: 150.0000 mg | ORAL_TABLET | Freq: Every day | ORAL | Status: DC

## 2015-04-14 ENCOUNTER — Ambulatory Visit (INDEPENDENT_AMBULATORY_CARE_PROVIDER_SITE_OTHER): Payer: 59 | Admitting: Family Medicine

## 2015-04-14 ENCOUNTER — Encounter: Payer: Self-pay | Admitting: Family Medicine

## 2015-04-14 VITALS — BP 122/84 | HR 87 | Ht 64.0 in | Wt 200.0 lb

## 2015-04-14 DIAGNOSIS — E282 Polycystic ovarian syndrome: Secondary | ICD-10-CM | POA: Diagnosis not present

## 2015-04-14 DIAGNOSIS — Z114 Encounter for screening for human immunodeficiency virus [HIV]: Secondary | ICD-10-CM

## 2015-04-14 DIAGNOSIS — F411 Generalized anxiety disorder: Secondary | ICD-10-CM | POA: Diagnosis not present

## 2015-04-14 DIAGNOSIS — F341 Dysthymic disorder: Secondary | ICD-10-CM

## 2015-04-14 DIAGNOSIS — R5383 Other fatigue: Secondary | ICD-10-CM | POA: Diagnosis not present

## 2015-04-14 LAB — CBC WITH DIFFERENTIAL/PLATELET
BASOS ABS: 0.1 10*3/uL (ref 0.0–0.1)
Basophils Relative: 1 % (ref 0–1)
Eosinophils Absolute: 0.3 10*3/uL (ref 0.0–0.7)
Eosinophils Relative: 3 % (ref 0–5)
HCT: 38.4 % (ref 36.0–46.0)
Hemoglobin: 12.6 g/dL (ref 12.0–15.0)
LYMPHS PCT: 21 % (ref 12–46)
Lymphs Abs: 2.1 10*3/uL (ref 0.7–4.0)
MCH: 30 pg (ref 26.0–34.0)
MCHC: 32.8 g/dL (ref 30.0–36.0)
MCV: 91.4 fL (ref 78.0–100.0)
MPV: 11.3 fL (ref 8.6–12.4)
Monocytes Absolute: 0.4 10*3/uL (ref 0.1–1.0)
Monocytes Relative: 4 % (ref 3–12)
Neutro Abs: 7.2 10*3/uL (ref 1.7–7.7)
Neutrophils Relative %: 71 % (ref 43–77)
PLATELETS: 289 10*3/uL (ref 150–400)
RBC: 4.2 MIL/uL (ref 3.87–5.11)
RDW: 12.4 % (ref 11.5–15.5)
WBC: 10.2 10*3/uL (ref 4.0–10.5)

## 2015-04-14 LAB — MAGNESIUM: Magnesium: 1.9 mg/dL (ref 1.5–2.5)

## 2015-04-14 LAB — TSH: TSH: 1.245 u[IU]/mL (ref 0.350–4.500)

## 2015-04-14 LAB — VITAMIN B12: Vitamin B-12: 399 pg/mL (ref 211–911)

## 2015-04-14 LAB — POCT GLYCOSYLATED HEMOGLOBIN (HGB A1C): Hemoglobin A1C: 5.4

## 2015-04-14 LAB — FERRITIN: FERRITIN: 100 ng/mL (ref 10–291)

## 2015-04-14 MED ORDER — CLONAZEPAM 1 MG PO TABS: 1.0000 mg | ORAL_TABLET | Freq: Every evening | ORAL | Status: DC | PRN

## 2015-04-14 MED ORDER — BUPROPION HCL ER (XL) 150 MG PO TB24: 150.0000 mg | ORAL_TABLET | Freq: Every day | ORAL | Status: DC

## 2015-04-14 NOTE — Progress Notes (Signed)
   Subjective:    Patient ID: Taylor [name], female    DOB: 10/14/1981, 34 y.o.   MRN: [id number]  HPI Anxiety/Depression - we went up on her Wellbutrin last jan. She went back down to 300mg  and doing well. She felt like life was a little better so went back down. Sleep if fiar. C/PO fatigue.  She does complain of little interest or pleasure in doing things several days of the week and feeling down several days of the week. She feels like she is hired and has low energy more than half the days and does have some difficulty with appetite and overeating. She also reports difficulty with concentration several days of the week. She denies any thoughts of wanting to harm herself.  Fatigue x 3-4 times.  Feels like sleeping ok but sometimes restles.. - normal periods and occ skips.  No secial diet.  No recent illness or URI.    PCOS - stopped the metformin.  Her endocrinology told her A1C came down enough she could stop it.  This was bout a year ago. Hasn't really lost any weight.  Periods are still irregular.   Review of Systems     Objective:   Physical Exam  Constitutional: She is oriented to person, place, and time. She appears well-developed and well-nourished.  HENT:  Head: Normocephalic and atraumatic.  Neck: Neck supple. No thyromegaly present.  Cardiovascular: Normal rate, regular rhythm and normal heart sounds.   Pulmonary/Chest: Effort normal and breath sounds normal.  Lymphadenopathy:    She has no cervical adenopathy.  Neurological: She is alert and oriented to person, place, and time.  Skin: Skin is warm and dry.  Psychiatric: She has a normal mood and affect. Her behavior is normal.          Assessment & Plan:  Anxiety /Depression - PHQ-9 score of 9, down from 12 in January. Gad 7 score of 7 today which is the same as previous. She rates her symptoms is somewhat difficult. We discussed options. She's happy with her current regimen and really doesn't want to adjust it. She can  always go back up on her Wellbutrin if needed for short period of time. We did refill her medications today. She still continues to use her clonazepam sparingly. She is using around 2 tabs per week. But occasionally does vary.  Polycystic ovary syndrome-her last A1c was about a year ago with her endocrinologist which is around the time he took her off the metformin. We'll recheck her A1c today. If it looks good then repeat in one year. If mildly elevated then repeat in 6 months.  Fatigue-we'll evaluate for B12 deficiency, iron deficiency and abnormal thyroid. Etc. screen for HIV.

## 2015-04-15 LAB — HIV ANTIBODY (ROUTINE TESTING W REFLEX): HIV 1&2 Ab, 4th Generation: NONREACTIVE

## 2015-04-17 LAB — VITAMIN B1: VITAMIN B1 (THIAMINE): 14 nmol/L (ref 8–30)

## 2015-05-06 ENCOUNTER — Ambulatory Visit: Payer: BLUE CROSS/BLUE SHIELD | Admitting: Family Medicine

## 2015-05-19 ENCOUNTER — Telehealth: Payer: Self-pay | Admitting: *Deleted

## 2015-05-19 MED ORDER — BUPROPION HCL ER (XL) 300 MG PO TB24: 300.0000 mg | ORAL_TABLET | Freq: Every day | ORAL | Status: DC

## 2015-05-19 NOTE — Telephone Encounter (Signed)
New rx sent. She can check at pharmacy.  Plase tell her I'm sorry.

## 2015-05-19 NOTE — Telephone Encounter (Signed)
Pt advised of Rx.  

## 2015-05-19 NOTE — Telephone Encounter (Signed)
Pt left vm stating that she is supposed to be on 300mg  of her wellbutrin.  Your last note from June is a little confusing because 300mg  was stated but only the 150mg  was sent.  Is it ok for me to refill the 300? Please advise.

## 2015-05-27 LAB — HM PAP SMEAR: HM Pap smear: NEGATIVE

## 2015-10-14 ENCOUNTER — Encounter: Payer: Self-pay | Admitting: Family Medicine

## 2015-10-14 ENCOUNTER — Ambulatory Visit (INDEPENDENT_AMBULATORY_CARE_PROVIDER_SITE_OTHER): Payer: BLUE CROSS/BLUE SHIELD | Admitting: Family Medicine

## 2015-10-14 VITALS — BP 110/85 | HR 93 | Wt 208.0 lb

## 2015-10-14 DIAGNOSIS — F329 Major depressive disorder, single episode, unspecified: Secondary | ICD-10-CM

## 2015-10-14 DIAGNOSIS — F32A Depression, unspecified: Secondary | ICD-10-CM

## 2015-10-14 DIAGNOSIS — E282 Polycystic ovarian syndrome: Secondary | ICD-10-CM | POA: Diagnosis not present

## 2015-10-14 DIAGNOSIS — F418 Other specified anxiety disorders: Secondary | ICD-10-CM

## 2015-10-14 DIAGNOSIS — F341 Dysthymic disorder: Secondary | ICD-10-CM

## 2015-10-14 DIAGNOSIS — F411 Generalized anxiety disorder: Secondary | ICD-10-CM | POA: Diagnosis not present

## 2015-10-14 DIAGNOSIS — F419 Anxiety disorder, unspecified: Principal | ICD-10-CM

## 2015-10-14 MED ORDER — BUPROPION HCL ER (XL) 300 MG PO TB24: 300.0000 mg | ORAL_TABLET | Freq: Every day | ORAL | Status: DC

## 2015-10-14 MED ORDER — LEVOCETIRIZINE DIHYDROCHLORIDE 5 MG PO TABS: ORAL_TABLET | ORAL | Status: DC

## 2015-10-14 MED ORDER — CLONAZEPAM 1 MG PO TABS: 1.0000 mg | ORAL_TABLET | Freq: Every evening | ORAL | Status: DC | PRN

## 2015-10-14 MED ORDER — SERTRALINE HCL 50 MG PO TABS: ORAL_TABLET | ORAL | Status: DC

## 2015-10-14 NOTE — Progress Notes (Signed)
   Subjective:    Patient ID: Taylor [name], female    DOB: 10-26-1981, 34 y.o.   MRN: [id number]  HPI Anxiety/Depression - When I last saw her 6 months ago she was not quite at a therapeutic goal with her medication but she was happy with her regimen and didn't want to change anything.  At that time she was using about 2 clonazepam per week. She feels like she is worrying more and not sleeping bc of it.  Says often its over small things.    She does complain of little interest or pleasure doing things several days of the week as well as feeling like she overeats and difficulty with concentration. She denies any thoughts of wanting to harm herself. She also complains of feeling nervous and on edge more than half the days as well as feeling like she worries too much about things. She also complains of some irritability more than half the days. She rates her symptoms as somewhat difficult.  Review of Systems     Objective:   Physical Exam  Constitutional: She is oriented to person, place, and time. She appears well-developed and well-nourished.  HENT:  Head: Normocephalic and atraumatic.  Cardiovascular: Normal rate, regular rhythm and normal heart sounds.   Pulmonary/Chest: Effort normal and breath sounds normal.  Neurological: She is alert and oriented to person, place, and time.  Skin: Skin is warm and dry.  Psychiatric: She has a normal mood and affect. Her behavior is normal.          Assessment & Plan:  Anxiety/Depression -   PHQ- 9 score of 12 and GAD-7 score of 12. Symptoms are uncontrolled right now. We discussed several options. She was on Zoloft for almost 2 years starting back in 2012. She did really well and eventually weaned to just the Wellbutrin. I think at this point because she is most concerned about her anxiety symptoms we will add the sertraline back. She does not remove her having any negative side effects on the medication. We will start with 50 mg and I'll see her  back in 6 weeks to see if we need to adjust the medication.   Had flu shot done at work in October.   Time spent 20 min, > 50% spent counseling about anxiety and depression.

## 2015-11-25 ENCOUNTER — Ambulatory Visit: Payer: BLUE CROSS/BLUE SHIELD | Admitting: Family Medicine

## 2015-12-08 ENCOUNTER — Ambulatory Visit: Payer: BLUE CROSS/BLUE SHIELD | Admitting: Family Medicine

## 2015-12-22 ENCOUNTER — Encounter: Payer: Self-pay | Admitting: Family Medicine

## 2015-12-22 ENCOUNTER — Ambulatory Visit (INDEPENDENT_AMBULATORY_CARE_PROVIDER_SITE_OTHER): Payer: BLUE CROSS/BLUE SHIELD | Admitting: Family Medicine

## 2015-12-22 VITALS — BP 124/72 | HR 78 | Wt 213.0 lb

## 2015-12-22 DIAGNOSIS — F419 Anxiety disorder, unspecified: Principal | ICD-10-CM

## 2015-12-22 DIAGNOSIS — F329 Major depressive disorder, single episode, unspecified: Secondary | ICD-10-CM

## 2015-12-22 DIAGNOSIS — F418 Other specified anxiety disorders: Secondary | ICD-10-CM

## 2015-12-22 DIAGNOSIS — F32A Depression, unspecified: Secondary | ICD-10-CM

## 2015-12-22 MED ORDER — FLUOXETINE HCL 10 MG PO CAPS: ORAL_CAPSULE | ORAL | Status: DC

## 2015-12-22 NOTE — Progress Notes (Signed)
   Subjective:    Patient ID: Taylor [name], female    DOB: June 21, 1981, 35 y.o.   MRN: [id number]  HPI F/U 8 weeks for anxiety depression.  She feels like she is sleeping too much.  She takes her medicine at night but even during the daytime feels excessively sleepy.  She says it started around the time that she started the sertraline. She said right now she feels like she could just lay down and take a nap. She does feel like it has really helped her anxiety overall but just says she's having a hard time dealing with the sedation. She is still taking Wellbutrin as well.   Review of Systems     Objective:   Physical Exam  Constitutional: She is oriented to person, place, and time. She appears well-developed and well-nourished.  HENT:  Head: Normocephalic and atraumatic.  Cardiovascular: Normal rate, regular rhythm and normal heart sounds.   Pulmonary/Chest: Effort normal and breath sounds normal.  Neurological: She is alert and oriented to person, place, and time.  Skin: Skin is warm and dry.  Psychiatric: She has a normal mood and affect. Her behavior is normal.          Assessment & Plan:  Anxiety/depression-Ganz 7 score went from 12 down to 1 which is fantastic. Her PHQ 9 score was 12 previously and today is 13. Because of the sedation and no significant effect on her depressive symptoms and would like to try to switch her to fluoxetine. Taper written out for the sertraline and will start fluoxetine. I'll see her back in 5 weeks. Call for any problems or side effects.  Time spent 20 minutes, greater than 50% time spent counseling about anxiety and depression

## 2015-12-22 NOTE — Patient Instructions (Signed)
Cut the sertraline in half. Take half tab daily for 7 days, then 1/2 tab every other day for 6 days.   Ok to start the fluoxetine once taking the sertraline every other day.

## 2015-12-29 ENCOUNTER — Encounter: Payer: Self-pay | Admitting: Family Medicine

## 2016-01-26 ENCOUNTER — Ambulatory Visit: Payer: BLUE CROSS/BLUE SHIELD | Admitting: Family Medicine

## 2016-02-01 ENCOUNTER — Ambulatory Visit: Payer: BLUE CROSS/BLUE SHIELD | Admitting: Family Medicine

## 2016-04-14 ENCOUNTER — Other Ambulatory Visit: Payer: Self-pay | Admitting: Family Medicine

## 2016-04-29 ENCOUNTER — Ambulatory Visit: Payer: BLUE CROSS/BLUE SHIELD | Admitting: Family Medicine

## 2016-05-02 ENCOUNTER — Ambulatory Visit (INDEPENDENT_AMBULATORY_CARE_PROVIDER_SITE_OTHER): Payer: BLUE CROSS/BLUE SHIELD | Admitting: Family Medicine

## 2016-05-02 ENCOUNTER — Encounter: Payer: Self-pay | Admitting: Family Medicine

## 2016-05-02 VITALS — BP 123/73 | HR 99 | Ht 64.0 in | Wt 209.0 lb

## 2016-05-02 DIAGNOSIS — F411 Generalized anxiety disorder: Secondary | ICD-10-CM | POA: Diagnosis not present

## 2016-05-02 DIAGNOSIS — E282 Polycystic ovarian syndrome: Secondary | ICD-10-CM | POA: Diagnosis not present

## 2016-05-02 DIAGNOSIS — R0683 Snoring: Secondary | ICD-10-CM | POA: Diagnosis not present

## 2016-05-02 DIAGNOSIS — F341 Dysthymic disorder: Secondary | ICD-10-CM | POA: Diagnosis not present

## 2016-05-02 MED ORDER — CLONAZEPAM 1 MG PO TABS: 1.0000 mg | ORAL_TABLET | Freq: Every evening | ORAL | Status: DC | PRN

## 2016-05-02 MED ORDER — BUPROPION HCL ER (XL) 300 MG PO TB24: 300.0000 mg | ORAL_TABLET | Freq: Every day | ORAL | Status: DC

## 2016-05-02 NOTE — Progress Notes (Signed)
Subjective:    CC: Mood  HPI: Follow-up anxiety depression-she does complain of feeling nervous and on edge several days of the week and some increased irritability daily. She also complains of decreased pleasure in doing things several days of the week as well as some fatigue and overeating. No thoughts of wanting to harm herself. When I last saw her we decided to taper off sertraline and start her on fluoxetine. She reports that she took the fluoxetine for couple weeks but it caused really bad headaches. She said she missed a couple doses over the weekend started actually feel better such as completely stopped it. Right now she does not want to continue with an SSRI. She just wants cystic with her Wellbutrin and as needed clonazepam.  She's also concerned that she could have sleep apnea. She says her husband recently mentioned to her that she has been stopping breathing at night while she sleeping. She says she has snored most of her adult life and had noted that her mom does the same. She does occasionally wake up with morning headaches. She has never been tested for screening for sleep apnea.  Past medical history, Surgical history, Family history not pertinant except as noted below, Social history, Allergies, and medications have been entered into the medical record, reviewed, and corrections made.   Review of Systems: No fevers, chills, night sweats, weight loss, chest pain, or shortness of breath.   Objective:    General: Well Developed, well nourished, and in no acute distress.  Neuro: Alert and oriented x3, extra-ocular muscles intact, sensation grossly intact.  HEENT: Normocephalic, atraumatic, OP is clear. She does not have enlarged tonsils.  Skin: Warm and dry, no rashes. Cardiac: Regular rate and rhythm, no murmurs rubs or gallops, no lower extremity edema.  Respiratory: Clear to auscultation bilaterally. Not using accessory muscles, speaking in full sentences.   Impression and  Recommendations:    Anxiety/depression-GAD 7 score of 7 and PHQ 9 score of 8. Continue with Wellbutrin and as needed clonazepam. Monitor for any increase in symptoms. We did discuss that if she does have sleep apnea that certainly could be contributing to low mood as well.  Snoring-stop bang questionnaire completed today. Questionnaire screened positive today with a score of 4. We'll go ahead and order in lab sleep study as she does have [employer]. They will not cover a home sleep study sleep study. Referral placed.

## 2016-05-09 ENCOUNTER — Ambulatory Visit: Payer: BLUE CROSS/BLUE SHIELD | Admitting: Family Medicine

## 2016-06-27 ENCOUNTER — Ambulatory Visit (HOSPITAL_BASED_OUTPATIENT_CLINIC_OR_DEPARTMENT_OTHER): Payer: BLUE CROSS/BLUE SHIELD | Attending: Family Medicine | Admitting: Internal Medicine

## 2016-06-27 VITALS — Ht 64.5 in | Wt 197.0 lb

## 2016-06-27 DIAGNOSIS — G4763 Sleep related bruxism: Secondary | ICD-10-CM | POA: Diagnosis not present

## 2016-06-27 DIAGNOSIS — Z79899 Other long term (current) drug therapy: Secondary | ICD-10-CM | POA: Insufficient documentation

## 2016-06-27 DIAGNOSIS — R0683 Snoring: Secondary | ICD-10-CM

## 2016-06-28 DIAGNOSIS — R0683 Snoring: Secondary | ICD-10-CM | POA: Diagnosis not present

## 2016-06-28 NOTE — Procedures (Signed)
  Patient Name: Taylor [name], [place] Study Date: 06/27/2016 Gender: Female D.O.B: [birth year] Age (years): 34 Referring Provider: Nani Gasseratherine Metheney Height (inches): 65 Interpreting Physician: Jetty Duhamellinton Teshaun Olarte MD, ABSM Weight (lbs): 197 RPSGT: Rolene ArbourMcConnico, Yvonne BMI: 33 MRN: [id number] Neck Size: 15.50 CLINICAL INFORMATION Sleep Study Type: NPSG Indication for sleep study: Snoring Epworth Sleepiness Score: 14  SLEEP STUDY TECHNIQUE As per the AASM Manual for the Scoring of Sleep and Associated Events v2.3 (April 2016) with a hypopnea requiring 4% desaturations. The channels recorded and monitored were frontal, central and occipital EEG, electrooculogram (EOG), submentalis EMG (chin), nasal and oral airflow, thoracic and abdominal wall motion, anterior tibialis EMG, snore microphone, electrocardiogram, and pulse oximetry.  MEDICATIONS Patient's medications include: charted for review Medications self-administered by patient during sleep study : CLONAZEPAM, LEVOCETIRIZINE.  SLEEP ARCHITECTURE The study was initiated at 10:29:48 PM and ended at 5:01:58 AM. Sleep onset time was 32.4 minutes and the sleep efficiency was 74.8%. The total sleep time was 293.3 minutes. Stage REM latency was 149.0 minutes. The patient spent 3.58% of the night in stage N1 sleep, 79.20% in stage N2 sleep, 0.00% in stage N3 and 17.22% in REM. Alpha intrusion was absent. Supine sleep was 30.78%.  RESPIRATORY PARAMETERS The overall apnea/hypopnea index (AHI) was 1.0 per hour. There were 0 total apneas, including 0 obstructive, 0 central and 0 mixed apneas. There were 5 hypopneas and 30 RERAs. The AHI during Stage REM sleep was 0.0 per hour. AHI while supine was 3.3 per hour. The mean oxygen saturation was 97.01%. The minimum SpO2 during sleep was 92.00%. Soft snoring was noted during this study.  CARDIAC DATA The 2 lead EKG demonstrated sinus rhythm. The mean heart rate was 79.74 beats per minute. Other EKG  findings include: None.  LEG MOVEMENT DATA The total PLMS were 3 with a resulting PLMS index of 0.61. Associated arousal with leg movement index was 0.0 .  IMPRESSIONS - No significant obstructive sleep apnea occurred during this study (AHI = 1.0/h). - No significant central sleep apnea occurred during this study (CAI = 0.0/h). - Moderate oxygen desaturation was noted during this study (Min O2 = 92.00%). - The patient snored with Soft snoring volume. - No cardiac abnormalities were noted during this study. - Clinically significant periodic limb movements did not occur during sleep. No significant associated arousals.  DIAGNOSIS - Bruxism (327.53 [G47.63 ICD-10]) - Normal study  RECOMMENDATIONS - Consider oral bite guard for bruxism - Avoid alcohol, sedatives and other CNS depressants that may worsen sleep apnea and disrupt normal sleep architecture. - Sleep hygiene should be reviewed to assess factors that may improve sleep quality. - Weight management and regular exercise should be initiated or continued if appropriate.  [Electronically signed] 06/28/2016 08:56 PM  Jetty Duhamellinton Denzil Mceachron MD, ABSM Diplomate, American Board of Sleep Medicine   NPI: [id number]  Waymon BudgeYOUNG,Violett Hobbs D Diplomate, American Board of Sleep Medicine  ELECTRONICALLY SIGNED ON:  06/28/2016, 8:53 PM [facility] PH: [phone]   FX: [phone] ACCREDITED BY THE AMERICAN ACADEMY OF SLEEP MEDICINE

## 2016-06-29 ENCOUNTER — Telehealth: Payer: Self-pay | Admitting: Family Medicine

## 2016-06-29 NOTE — Telephone Encounter (Signed)
Please call patient and let her know sleep study results. She does not have sleep apnea. She does not have restless leg. And she had no arrhythmias while sleeping.  IMPRESSIONS - No significant obstructive sleep apnea occurred during this study (AHI = 1.0/h). - No significant central sleep apnea occurred during this study (CAI = 0.0/h). - Moderate oxygen desaturation was noted during this study (Min O2 = 92.00%). - The patient snored with Soft snoring volume. - No cardiac abnormalities were noted during this study. - Clinically significant periodic limb movements did not occur during sleep. No significant associated arousals.

## 2016-07-06 NOTE — Telephone Encounter (Signed)
Pt informed.Taylor [name]  

## 2016-07-13 ENCOUNTER — Other Ambulatory Visit: Payer: Self-pay | Admitting: *Deleted

## 2016-07-13 MED ORDER — BUPROPION HCL ER (XL) 300 MG PO TB24: 300.0000 mg | ORAL_TABLET | Freq: Every day | ORAL | 1 refills | Status: DC

## 2016-08-10 LAB — LIPID PANEL
CHOLESTEROL: 146 mg/dL (ref 0–200)
CHOLESTEROL: 155 mg/dL (ref 0–200)
HDL: 52 mg/dL (ref 35–70)
HDL: 58 mg/dL (ref 35–70)
LDL Cholesterol: 69 mg/dL
TRIGLYCERIDES: 125 mg/dL (ref 40–160)
Triglycerides: 138 mg/dL (ref 40–160)

## 2016-08-10 LAB — INSULIN, FASTING: INSULIN: 14.3

## 2016-08-10 LAB — HEMOGLOBIN A1C
HEMOGLOBIN A1C: 5.5
Hemoglobin A1C: 5.4

## 2016-08-10 LAB — BASIC METABOLIC PANEL: Glucose: 97 mg/dL

## 2016-08-10 LAB — VITAMIN D 25 HYDROXY (VIT D DEFICIENCY, FRACTURES): VIT D 25 HYDROXY: 46.5

## 2016-10-19 ENCOUNTER — Other Ambulatory Visit: Payer: Self-pay | Admitting: Family Medicine

## 2016-10-19 DIAGNOSIS — F341 Dysthymic disorder: Secondary | ICD-10-CM

## 2016-10-19 DIAGNOSIS — F411 Generalized anxiety disorder: Secondary | ICD-10-CM

## 2016-10-19 DIAGNOSIS — E282 Polycystic ovarian syndrome: Secondary | ICD-10-CM

## 2016-10-25 ENCOUNTER — Encounter: Payer: Self-pay | Admitting: Family Medicine

## 2016-11-29 ENCOUNTER — Encounter: Payer: Self-pay | Admitting: Family Medicine

## 2016-11-29 ENCOUNTER — Ambulatory Visit (INDEPENDENT_AMBULATORY_CARE_PROVIDER_SITE_OTHER): Payer: BLUE CROSS/BLUE SHIELD | Admitting: Family Medicine

## 2016-11-29 VITALS — BP 135/84 | HR 87 | Temp 98.4°F | Wt 184.0 lb

## 2016-11-29 DIAGNOSIS — R059 Cough, unspecified: Secondary | ICD-10-CM

## 2016-11-29 DIAGNOSIS — R05 Cough: Secondary | ICD-10-CM

## 2016-11-29 MED ORDER — PREDNISONE 10 MG PO TABS: 30.0000 mg | ORAL_TABLET | Freq: Every day | ORAL | 0 refills | Status: DC

## 2016-11-29 MED ORDER — GUAIFENESIN-CODEINE 100-10 MG/5ML PO SOLN
5.0000 mL | Freq: Every evening | ORAL | 0 refills | Status: DC | PRN
Start: 2016-11-29 — End: 2017-02-06

## 2016-11-29 MED ORDER — IPRATROPIUM BROMIDE 0.06 % NA SOLN: 2.0000 | NASAL | 6 refills | Status: DC | PRN
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
## 2016-11-29 NOTE — Patient Instructions (Signed)
Thank you for coming in today. Call or go to the emergency room if you get worse, have trouble breathing, have chest pains, or palpitations.    Acute Bronchitis, Adult Acute bronchitis is when air tubes (bronchi) in the lungs suddenly get swollen. The condition can make it hard to breathe. It can also cause these symptoms:  A cough.  Coughing up clear, yellow, or green mucus.  Wheezing.  Chest congestion.  Shortness of breath.  A fever.  Body aches.  Chills.  A sore throat. Follow these instructions at home: Medicines  Take over-the-counter and prescription medicines only as told by your doctor.  If you were prescribed an antibiotic medicine, take it as told by your doctor. Do not stop taking the antibiotic even if you start to feel better. General instructions  Rest.  Drink enough fluids to keep your pee (urine) clear or pale yellow.  Avoid smoking and secondhand smoke. If you smoke and you need help quitting, ask your doctor. Quitting will help your lungs heal faster.  Use an inhaler, cool mist vaporizer, or humidifier as told by your doctor.  Keep all follow-up visits as told by your doctor. This is important. How is this prevented? To lower your risk of getting this condition again:  Wash your hands often with soap and water. If you cannot use soap and water, use hand sanitizer.  Avoid contact with people who have cold symptoms.  Try not to touch your hands to your mouth, nose, or eyes.  Make sure to get the flu shot every year. Contact a doctor if:  Your symptoms do not get better in 2 weeks. Get help right away if:  You cough up blood.  You have chest pain.  You have very bad shortness of breath.  You become dehydrated.  You faint (pass out) or keep feeling like you are going to pass out.  You keep throwing up (vomiting).  You have a very bad headache.  Your fever or chills gets worse. This information is not intended to replace advice  given to you by your health care provider. Make sure you discuss any questions you have with your health care provider. Document Released: 04/04/2008 Document Revised: 05/25/2016 Document Reviewed: 04/06/2016 Elsevier Interactive Patient Education  2017 Elsevier Inc.  

## 2017-02-05 ENCOUNTER — Other Ambulatory Visit: Payer: Self-pay | Admitting: Family Medicine

## 2017-02-06 NOTE — Telephone Encounter (Signed)
appt scheduled on 02/07/2017
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
## 2017-02-07 ENCOUNTER — Ambulatory Visit (INDEPENDENT_AMBULATORY_CARE_PROVIDER_SITE_OTHER): Payer: BLUE CROSS/BLUE SHIELD | Admitting: Family Medicine

## 2017-02-07 VITALS — BP 121/82 | HR 85 | Wt 184.0 lb

## 2017-02-07 DIAGNOSIS — F988 Other specified behavioral and emotional disorders with onset usually occurring in childhood and adolescence: Secondary | ICD-10-CM | POA: Diagnosis not present

## 2017-02-07 DIAGNOSIS — F341 Dysthymic disorder: Secondary | ICD-10-CM

## 2017-02-07 MED ORDER — LISDEXAMFETAMINE DIMESYLATE 20 MG PO CAPS: 20.0000 mg | ORAL_CAPSULE | ORAL | 0 refills | Status: DC

## 2017-02-07 MED ORDER — BUPROPION HCL ER (XL) 450 MG PO TB24: 450.0000 mg | ORAL_TABLET | Freq: Every day | ORAL | 1 refills | Status: DC

## 2017-02-27 ENCOUNTER — Other Ambulatory Visit: Payer: Self-pay | Admitting: Family Medicine

## 2017-02-27 ENCOUNTER — Telehealth: Payer: Self-pay | Admitting: *Deleted

## 2017-02-27 DIAGNOSIS — E282 Polycystic ovarian syndrome: Secondary | ICD-10-CM

## 2017-02-27 DIAGNOSIS — F411 Generalized anxiety disorder: Secondary | ICD-10-CM

## 2017-02-27 DIAGNOSIS — F341 Dysthymic disorder: Secondary | ICD-10-CM

## 2017-02-27 MED ORDER — CLONAZEPAM 1 MG PO TABS: 1.0000 mg | ORAL_TABLET | Freq: Every evening | ORAL | 1 refills | Status: DC | PRN

## 2017-02-27 MED ORDER — BUPROPION HCL ER (XL) 300 MG PO TB24: 300.0000 mg | ORAL_TABLET | Freq: Every day | ORAL | 1 refills | Status: DC

## 2017-02-27 MED ORDER — BUPROPION HCL ER (XL) 150 MG PO TB24
150.0000 mg | ORAL_TABLET | Freq: Every day | ORAL | 1 refills | Status: DC
Start: 2017-02-27 — End: 2017-08-20

## 2017-02-27 NOTE — Telephone Encounter (Signed)
Called pt after calling the both pharmacy's and found out that Bupropion 300 mg [facility] [address] And this was the only Rx that was sent.   [facility] then [facility] [address] and was informed by Rosanne Ashing that the 450 mg that was ordered would take 1 wk to come in and that it would be cheaper for her to get the 300 mg and the 150 mg separately.  I called pt and informed her of what I had spoken to both pharmacy's about and she stated that she would be fine with the 2 separate meds if this is what Dr. Linford Arnold recommended. I told her that I would fwd this to Dr. Linford Arnold for advice. She also asked about the Rx for Klonopin. I told her that I would ask about this also and if approved we would send [facility] [address] for her. She voiced understanding and agreed.Loralee Pacas [place]

## 2017-02-27 NOTE — Telephone Encounter (Signed)
Meds approved. Will send. Will split wellbutrin.

## 2017-02-27 NOTE — Telephone Encounter (Signed)
Pt lvm stating that she was seen by Dr. Linford Arnold and asked that her medication be [facility] [address]. She stated that this was not done and that [facility] was [facility] on [address] And that all of the medication was not sent.Taylor [name]

## 2017-07-05 DIAGNOSIS — F4323 Adjustment disorder with mixed anxiety and depressed mood: Secondary | ICD-10-CM | POA: Diagnosis not present

## 2017-07-06 DIAGNOSIS — F4310 Post-traumatic stress disorder, unspecified: Secondary | ICD-10-CM | POA: Diagnosis not present

## 2017-07-11 DIAGNOSIS — F4323 Adjustment disorder with mixed anxiety and depressed mood: Secondary | ICD-10-CM | POA: Diagnosis not present

## 2017-07-17 DIAGNOSIS — F4310 Post-traumatic stress disorder, unspecified: Secondary | ICD-10-CM | POA: Diagnosis not present

## 2017-07-18 DIAGNOSIS — F4323 Adjustment disorder with mixed anxiety and depressed mood: Secondary | ICD-10-CM | POA: Diagnosis not present

## 2017-08-01 DIAGNOSIS — F4323 Adjustment disorder with mixed anxiety and depressed mood: Secondary | ICD-10-CM | POA: Diagnosis not present

## 2017-08-02 DIAGNOSIS — F4310 Post-traumatic stress disorder, unspecified: Secondary | ICD-10-CM | POA: Diagnosis not present

## 2017-08-08 ENCOUNTER — Encounter: Payer: Self-pay | Admitting: Family Medicine

## 2017-08-08 ENCOUNTER — Ambulatory Visit (INDEPENDENT_AMBULATORY_CARE_PROVIDER_SITE_OTHER): Payer: BLUE CROSS/BLUE SHIELD | Admitting: Family Medicine

## 2017-08-08 VITALS — BP 115/77 | HR 84 | Ht 65.0 in | Wt 185.0 lb

## 2017-08-08 DIAGNOSIS — F988 Other specified behavioral and emotional disorders with onset usually occurring in childhood and adolescence: Secondary | ICD-10-CM | POA: Diagnosis not present

## 2017-08-08 DIAGNOSIS — Z23 Encounter for immunization: Secondary | ICD-10-CM

## 2017-08-08 DIAGNOSIS — R7301 Impaired fasting glucose: Secondary | ICD-10-CM

## 2017-08-08 DIAGNOSIS — F341 Dysthymic disorder: Secondary | ICD-10-CM | POA: Diagnosis not present

## 2017-08-08 LAB — LIPID PANEL W/REFLEX DIRECT LDL
Cholesterol: 151 mg/dL (ref <200)
HDL: 67 mg/dL (ref >50)
LDL Cholesterol (Calc): 61 mg/dL (calc)
Non-HDL Cholesterol (Calc): 84 mg/dL (calc) (ref <130)
Total CHOL/HDL Ratio: 2.3 (calc) (ref <5.0)
Triglycerides: 151 mg/dL — ABNORMAL HIGH (ref <150)

## 2017-08-08 LAB — COMPLETE METABOLIC PANEL WITH GFR
AG Ratio: 1.4 (calc) (ref 1.0–2.5)
ALT: 13 U/L (ref 6–29)
AST: 14 U/L (ref 10–30)
Albumin: 4.2 g/dL (ref 3.6–5.1)
Alkaline phosphatase (APISO): 85 U/L (ref 33–115)
BUN: 13 mg/dL (ref 7–25)
CO2: 23 mmol/L (ref 20–32)
Calcium: 9.3 mg/dL (ref 8.6–10.2)
Chloride: 102 mmol/L (ref 98–110)
Creat: 0.81 mg/dL (ref 0.50–1.10)
GFR, Est African American: 107 mL/min/{1.73_m2} (ref >=60)
GFR, Est Non African American: 92 mL/min/{1.73_m2} (ref >=60)
Globulin: 2.9 g/dL (calc) (ref 1.9–3.7)
Glucose, Bld: 89 mg/dL (ref 65–99)
Potassium: 4.3 mmol/L (ref 3.5–5.3)
Sodium: 134 mmol/L — ABNORMAL LOW (ref 135–146)
Total Bilirubin: 0.4 mg/dL (ref 0.2–1.2)
Total Protein: 7.1 g/dL (ref 6.1–8.1)

## 2017-08-08 LAB — POCT GLYCOSYLATED HEMOGLOBIN (HGB A1C): HEMOGLOBIN A1C: 5.6

## 2017-08-08 MED ORDER — LISDEXAMFETAMINE DIMESYLATE 20 MG PO CAPS: 20.0000 mg | ORAL_CAPSULE | ORAL | 0 refills | Status: DC
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
## 2017-08-10 DIAGNOSIS — F4310 Post-traumatic stress disorder, unspecified: Secondary | ICD-10-CM | POA: Diagnosis not present

## 2017-08-15 DIAGNOSIS — F4323 Adjustment disorder with mixed anxiety and depressed mood: Secondary | ICD-10-CM | POA: Diagnosis not present

## 2017-08-20 ENCOUNTER — Other Ambulatory Visit: Payer: Self-pay | Admitting: Family Medicine

## 2017-08-24 DIAGNOSIS — F4323 Adjustment disorder with mixed anxiety and depressed mood: Secondary | ICD-10-CM | POA: Diagnosis not present

## 2017-08-25 DIAGNOSIS — F4310 Post-traumatic stress disorder, unspecified: Secondary | ICD-10-CM | POA: Diagnosis not present

## 2017-09-05 ENCOUNTER — Other Ambulatory Visit: Payer: Self-pay | Admitting: Family Medicine

## 2017-09-05 DIAGNOSIS — F341 Dysthymic disorder: Secondary | ICD-10-CM

## 2017-09-05 DIAGNOSIS — F411 Generalized anxiety disorder: Secondary | ICD-10-CM

## 2017-09-05 DIAGNOSIS — F4310 Post-traumatic stress disorder, unspecified: Secondary | ICD-10-CM | POA: Diagnosis not present

## 2017-09-05 DIAGNOSIS — E282 Polycystic ovarian syndrome: Secondary | ICD-10-CM

## 2017-09-12 DIAGNOSIS — F4310 Post-traumatic stress disorder, unspecified: Secondary | ICD-10-CM | POA: Diagnosis not present

## 2017-09-19 DIAGNOSIS — F4310 Post-traumatic stress disorder, unspecified: Secondary | ICD-10-CM | POA: Diagnosis not present

## 2017-09-29 DIAGNOSIS — F4310 Post-traumatic stress disorder, unspecified: Secondary | ICD-10-CM | POA: Diagnosis not present

## 2017-10-20 DIAGNOSIS — F4310 Post-traumatic stress disorder, unspecified: Secondary | ICD-10-CM | POA: Diagnosis not present

## 2017-10-27 DIAGNOSIS — F439 Reaction to severe stress, unspecified: Secondary | ICD-10-CM | POA: Diagnosis not present

## 2017-11-07 DIAGNOSIS — L821 Other seborrheic keratosis: Secondary | ICD-10-CM | POA: Diagnosis not present

## 2017-11-07 DIAGNOSIS — F4310 Post-traumatic stress disorder, unspecified: Secondary | ICD-10-CM | POA: Diagnosis not present

## 2017-11-07 DIAGNOSIS — D2372 Other benign neoplasm of skin of left lower limb, including hip: Secondary | ICD-10-CM | POA: Diagnosis not present

## 2017-11-07 DIAGNOSIS — D485 Neoplasm of uncertain behavior of skin: Secondary | ICD-10-CM | POA: Diagnosis not present

## 2017-11-07 DIAGNOSIS — L814 Other melanin hyperpigmentation: Secondary | ICD-10-CM | POA: Diagnosis not present

## 2017-11-07 DIAGNOSIS — L820 Inflamed seborrheic keratosis: Secondary | ICD-10-CM | POA: Diagnosis not present

## 2017-11-07 DIAGNOSIS — D1801 Hemangioma of skin and subcutaneous tissue: Secondary | ICD-10-CM | POA: Diagnosis not present

## 2017-11-10 DIAGNOSIS — F4310 Post-traumatic stress disorder, unspecified: Secondary | ICD-10-CM | POA: Diagnosis not present

## 2017-11-22 DIAGNOSIS — F4310 Post-traumatic stress disorder, unspecified: Secondary | ICD-10-CM | POA: Diagnosis not present

## 2017-11-24 DIAGNOSIS — F4310 Post-traumatic stress disorder, unspecified: Secondary | ICD-10-CM | POA: Diagnosis not present

## 2017-12-08 DIAGNOSIS — F4310 Post-traumatic stress disorder, unspecified: Secondary | ICD-10-CM | POA: Diagnosis not present

## 2017-12-12 ENCOUNTER — Ambulatory Visit: Payer: BLUE CROSS/BLUE SHIELD | Admitting: Family Medicine

## 2018-01-01 ENCOUNTER — Encounter: Payer: Self-pay | Admitting: Family Medicine

## 2018-01-01 ENCOUNTER — Ambulatory Visit: Payer: BLUE CROSS/BLUE SHIELD | Admitting: Family Medicine

## 2018-01-01 VITALS — BP 115/70 | HR 94 | Ht 65.0 in | Wt 184.0 lb

## 2018-01-01 DIAGNOSIS — F988 Other specified behavioral and emotional disorders with onset usually occurring in childhood and adolescence: Secondary | ICD-10-CM

## 2018-01-01 DIAGNOSIS — E282 Polycystic ovarian syndrome: Secondary | ICD-10-CM

## 2018-01-01 DIAGNOSIS — F341 Dysthymic disorder: Secondary | ICD-10-CM

## 2018-01-01 DIAGNOSIS — F411 Generalized anxiety disorder: Secondary | ICD-10-CM

## 2018-01-01 MED ORDER — BUPROPION HCL ER (XL) 300 MG PO TB24: 300.0000 mg | ORAL_TABLET | Freq: Every day | ORAL | 1 refills | Status: DC

## 2018-01-01 MED ORDER — CLONAZEPAM 1 MG PO TABS: ORAL_TABLET | ORAL | 1 refills | Status: DC

## 2018-01-01 MED ORDER — LISDEXAMFETAMINE DIMESYLATE 20 MG PO CAPS: 20.0000 mg | ORAL_CAPSULE | Freq: Every day | ORAL | 0 refills | Status: DC

## 2018-01-01 MED ORDER — LISDEXAMFETAMINE DIMESYLATE 20 MG PO CAPS: 20.0000 mg | ORAL_CAPSULE | ORAL | 0 refills | Status: DC
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
## 2018-04-26 DIAGNOSIS — Z01419 Encounter for gynecological examination (general) (routine) without abnormal findings: Secondary | ICD-10-CM | POA: Diagnosis not present

## 2018-04-26 DIAGNOSIS — Z1321 Encounter for screening for nutritional disorder: Secondary | ICD-10-CM | POA: Diagnosis not present

## 2018-04-26 DIAGNOSIS — Z6831 Body mass index (BMI) 31.0-31.9, adult: Secondary | ICD-10-CM | POA: Diagnosis not present

## 2018-04-26 DIAGNOSIS — N951 Menopausal and female climacteric states: Secondary | ICD-10-CM | POA: Diagnosis not present

## 2018-04-26 DIAGNOSIS — Z1329 Encounter for screening for other suspected endocrine disorder: Secondary | ICD-10-CM | POA: Diagnosis not present

## 2018-04-26 LAB — HM PAP SMEAR: HM Pap smear: NEGATIVE

## 2018-04-26 LAB — RESULTS CONSOLE HPV: CHL HPV: NEGATIVE

## 2018-05-08 ENCOUNTER — Ambulatory Visit: Payer: BLUE CROSS/BLUE SHIELD | Admitting: Family Medicine

## 2018-08-06 ENCOUNTER — Ambulatory Visit: Payer: BLUE CROSS/BLUE SHIELD | Admitting: Family Medicine

## 2018-08-06 ENCOUNTER — Encounter: Payer: Self-pay | Admitting: Family Medicine

## 2018-08-06 DIAGNOSIS — Z23 Encounter for immunization: Secondary | ICD-10-CM

## 2018-08-06 DIAGNOSIS — F988 Other specified behavioral and emotional disorders with onset usually occurring in childhood and adolescence: Secondary | ICD-10-CM

## 2018-08-06 DIAGNOSIS — F411 Generalized anxiety disorder: Secondary | ICD-10-CM

## 2018-08-06 DIAGNOSIS — E282 Polycystic ovarian syndrome: Secondary | ICD-10-CM | POA: Diagnosis not present

## 2018-08-06 DIAGNOSIS — F341 Dysthymic disorder: Secondary | ICD-10-CM | POA: Diagnosis not present

## 2018-08-06 MED ORDER — CLONAZEPAM 1 MG PO TABS: ORAL_TABLET | ORAL | 1 refills | Status: DC

## 2018-08-06 MED ORDER — LISDEXAMFETAMINE DIMESYLATE 20 MG PO CAPS
20.0000 mg | ORAL_CAPSULE | Freq: Every day | ORAL | 0 refills | Status: DC
Start: 2018-10-19 — End: 2019-02-05

## 2018-08-06 MED ORDER — LISDEXAMFETAMINE DIMESYLATE 20 MG PO CAPS: 20.0000 mg | ORAL_CAPSULE | Freq: Every day | ORAL | 0 refills | Status: DC

## 2018-08-06 MED ORDER — LISDEXAMFETAMINE DIMESYLATE 20 MG PO CAPS: 20.0000 mg | ORAL_CAPSULE | ORAL | 0 refills | Status: DC

## 2018-08-06 MED ORDER — BUPROPION HCL ER (XL) 300 MG PO TB24: 300.0000 mg | ORAL_TABLET | Freq: Every day | ORAL | 1 refills | Status: DC

## 2018-08-06 NOTE — Progress Notes (Signed)
Subjective:    CC: 6 mo f/u ADD  HPI:  ADD - Reports symptoms are well controlled on current regime. Denies any problems with insomnia, chest pain, palpitations, or SOB.  She really uses her Vyvanse PRN.  She doesn't need a refill today.    Follow-up depression/anxiety- she is doing well overall. Still struggling with some anxiety symptoms.  She denies any S.E.    Past medical history, Surgical history, Family history not pertinant except as noted below, Social history, Allergies, and medications have been entered into the medical record, reviewed, and corrections made.   Review of Systems: No fevers, chills, night sweats, weight loss, chest pain, or shortness of breath.   Objective:    General: Well Developed, well nourished, and in no acute distress.  Neuro: Alert and oriented x3, extra-ocular muscles intact, sensation grossly intact.  HEENT: Normocephalic, atraumatic  Skin: Warm and dry, no rashes. Cardiac: Regular rate and rhythm, no murmurs rubs or gallops, no lower extremity edema.  Respiratory: Clear to auscultation bilaterally. Not using accessory muscles, speaking in full sentences.   Impression and Recommendations:    ADD -doing well on current regimen.BP at goal.  She doesn't need refill yet.    Depression/anxiety-doing well overall.  PHQ 9 score of 2 and that is for sleep and energy only.  Gad 7 score of 6.  She rates her symptoms is somewhat difficult.  PCOS - will get records from her GYN. She says they did labs as well. Hopefully they checked an A1C.

## 2018-08-23 ENCOUNTER — Encounter: Payer: Self-pay | Admitting: Family Medicine

## 2018-11-13 ENCOUNTER — Telehealth: Payer: Self-pay

## 2018-11-13 DIAGNOSIS — E8881 Metabolic syndrome: Secondary | ICD-10-CM

## 2018-11-13 DIAGNOSIS — E282 Polycystic ovarian syndrome: Secondary | ICD-10-CM

## 2018-11-13 NOTE — Telephone Encounter (Signed)
Rosetta called and states she would like a referral for Endocrinologist for insulin resistance with PCOS. She states she has gone to an Actor for years and just haven't been in the last couple of years.

## 2018-11-13 NOTE — Telephone Encounter (Signed)
Referral sent 

## 2018-11-13 NOTE — Telephone Encounter (Signed)
Ok to place refreral.

## 2018-12-13 DIAGNOSIS — L680 Hirsutism: Secondary | ICD-10-CM | POA: Insufficient documentation

## 2018-12-13 DIAGNOSIS — R7303 Prediabetes: Secondary | ICD-10-CM | POA: Diagnosis not present

## 2018-12-13 DIAGNOSIS — E282 Polycystic ovarian syndrome: Secondary | ICD-10-CM | POA: Diagnosis not present

## 2019-02-05 ENCOUNTER — Ambulatory Visit (INDEPENDENT_AMBULATORY_CARE_PROVIDER_SITE_OTHER): Payer: BLUE CROSS/BLUE SHIELD | Admitting: Family Medicine

## 2019-02-05 ENCOUNTER — Other Ambulatory Visit: Payer: Self-pay

## 2019-02-05 ENCOUNTER — Encounter: Payer: Self-pay | Admitting: Family Medicine

## 2019-02-05 VITALS — Temp 96.4°F | Ht 65.0 in | Wt 188.0 lb

## 2019-02-05 DIAGNOSIS — F988 Other specified behavioral and emotional disorders with onset usually occurring in childhood and adolescence: Secondary | ICD-10-CM

## 2019-02-05 DIAGNOSIS — Z13220 Encounter for screening for lipoid disorders: Secondary | ICD-10-CM | POA: Diagnosis not present

## 2019-02-05 DIAGNOSIS — E282 Polycystic ovarian syndrome: Secondary | ICD-10-CM | POA: Diagnosis not present

## 2019-02-05 DIAGNOSIS — F341 Dysthymic disorder: Secondary | ICD-10-CM

## 2019-02-05 MED ORDER — LISDEXAMFETAMINE DIMESYLATE 20 MG PO CAPS: 20.0000 mg | ORAL_CAPSULE | ORAL | 0 refills | Status: DC

## 2019-02-05 MED ORDER — LISDEXAMFETAMINE DIMESYLATE 20 MG PO CAPS: 20.0000 mg | ORAL_CAPSULE | Freq: Every day | ORAL | 0 refills | Status: DC

## 2019-02-05 MED ORDER — BUPROPION HCL ER (XL) 300 MG PO TB24: 300.0000 mg | ORAL_TABLET | Freq: Every day | ORAL | 1 refills | Status: DC

## 2019-02-05 NOTE — Progress Notes (Signed)
Virtual Visit via Video Note  I connected with Taylor [name] on 02/05/19 at  8:10 AM EDT by a video enabled telemedicine application and verified that I am speaking with the correct person using two identifiers.   I discussed the limitations of evaluation and management by telemedicine and the availability of in person appointments. The patient expressed understanding and agreed to proceed.  Subjective:    CC: 6 mo f/u Mood  HPI:  Taylor [name] is a 37 year old female who is following up on mood today.  She was last seen in Oct 2019, a year ago.  She has a diagnosis of depression and anxiety.  She currently takes Wellbutrin.  She was attending therapy a year ago.  ADD - Reports symptoms are well controlled on current regime. Denies any problems with insomnia, chest pain, palpitations, or SOB.    Also recently started on metformin for polycystic ovarian syndrome. Still taking one a day.  Gets some loose stools.   Past medical history, Surgical history, Family history not pertinant except as noted below, Social history, Allergies, and medications have been entered into the medical record, reviewed, and corrections made.   Review of Systems: No fevers, chills, night sweats, weight loss, chest pain, or shortness of breath.   Objective:    General: Speaking clearly in complete sentences without any shortness of breath.  Alert and oriented x3.  Normal judgment. No apparent acute distress.   Impression and Recommendations:    Depression/anxiety-overall she is actually doing well especially considering the pandemic going on right now.  She does not feel like her anxiety levels are more than usual.  She is happy to just continue with her regular regimen of Wellbutrin 300 mg daily.  Refill sent for 6 months.  Follow-up in 6 months.  Did encourage her to call if any point she feels like she is getting more stressed or overwhelmed.  ADD-happy with current regimen.  She actually does not take it every day  so normally 90 tabs actually lasts her about 6 months.  Did go ahead and send prescription refills that are dated today.  PCOS -far doing well on her metformin.  Did add to her medication list.  She is due for screening lipids it has been over a year so I did put in a lab order for her to go sometime this summer.    I discussed the assessment and treatment plan with the patient. The patient was provided an opportunity to ask questions and all were answered. The patient agreed with the plan and demonstrated an understanding of the instructions.   The patient was advised to call back or seek an in-person evaluation if the symptoms worsen or if the condition fails to improve as anticipated.   Nani Gasser, MD

## 2019-03-14 ENCOUNTER — Other Ambulatory Visit: Payer: Self-pay | Admitting: Family Medicine

## 2019-06-27 DIAGNOSIS — R7303 Prediabetes: Secondary | ICD-10-CM | POA: Diagnosis not present

## 2019-06-27 DIAGNOSIS — L680 Hirsutism: Secondary | ICD-10-CM | POA: Diagnosis not present

## 2019-06-27 DIAGNOSIS — E282 Polycystic ovarian syndrome: Secondary | ICD-10-CM | POA: Diagnosis not present

## 2019-08-06 ENCOUNTER — Other Ambulatory Visit: Payer: Self-pay

## 2019-08-06 ENCOUNTER — Encounter: Payer: Self-pay | Admitting: Family Medicine

## 2019-08-06 ENCOUNTER — Ambulatory Visit: Payer: 59 | Admitting: Family Medicine

## 2019-08-06 VITALS — BP 124/80 | HR 81 | Ht 65.0 in | Wt 196.0 lb

## 2019-08-06 DIAGNOSIS — R7301 Impaired fasting glucose: Secondary | ICD-10-CM | POA: Diagnosis not present

## 2019-08-06 DIAGNOSIS — F411 Generalized anxiety disorder: Secondary | ICD-10-CM

## 2019-08-06 DIAGNOSIS — F341 Dysthymic disorder: Secondary | ICD-10-CM | POA: Diagnosis not present

## 2019-08-06 DIAGNOSIS — E282 Polycystic ovarian syndrome: Secondary | ICD-10-CM

## 2019-08-06 DIAGNOSIS — Z23 Encounter for immunization: Secondary | ICD-10-CM | POA: Diagnosis not present

## 2019-08-06 DIAGNOSIS — R635 Abnormal weight gain: Secondary | ICD-10-CM

## 2019-08-06 DIAGNOSIS — F988 Other specified behavioral and emotional disorders with onset usually occurring in childhood and adolescence: Secondary | ICD-10-CM

## 2019-08-06 DIAGNOSIS — Z13220 Encounter for screening for lipoid disorders: Secondary | ICD-10-CM

## 2019-08-06 LAB — POCT GLYCOSYLATED HEMOGLOBIN (HGB A1C): Hemoglobin A1C: 5.2 % (ref 4.0–5.6)

## 2019-08-06 MED ORDER — BUPROPION HCL ER (XL) 300 MG PO TB24: 300.0000 mg | ORAL_TABLET | Freq: Every day | ORAL | 1 refills | Status: DC

## 2019-08-06 MED ORDER — CLONAZEPAM 1 MG PO TABS: ORAL_TABLET | ORAL | 1 refills | Status: DC

## 2019-08-06 MED ORDER — PHENTERMINE HCL 37.5 MG PO TABS: 37.5000 mg | ORAL_TABLET | Freq: Every day | ORAL | 0 refills | Status: DC

## 2019-08-06 MED ORDER — LISDEXAMFETAMINE DIMESYLATE 20 MG PO CAPS: 20.0000 mg | ORAL_CAPSULE | Freq: Every day | ORAL | 0 refills | Status: DC

## 2019-08-06 NOTE — Assessment & Plan Note (Signed)
Well controlled. Continue current regimen. Follow up in  12 mo.  A1C of 5.2.

## 2019-08-06 NOTE — Assessment & Plan Note (Signed)
Symptoms have ramped up more recently since working from home.  It just does not quite have the same structure to it is going to work and she misses that.  Plus her husband is also working from home.  She is currently working off her living room couch so it is really not the best set up.  This is really impacted her mood in a negative way.  We discussed some strategies around really finding a place in her home that will be a great office set up for her since there are no plans for her to return to the office anytime soon.  Also discussed maybe working with a English as a second language teacher.  I think this could actually be really helpful for her and if needed we can always consider addition to her Wellbutrin.  She is very happy with her Wellbutrin and definitely wants to continue that.  She will reach out to me in the next month or 2 if she would like to make some changes.

## 2019-08-06 NOTE — Assessment & Plan Note (Signed)
She is happy with her current regimen and feels like it is effective.  She really only uses it occasionally.  Right now at work a lot of things are very routine for her and so she really only takes it a few times a month.
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
## 2019-08-06 NOTE — Assessment & Plan Note (Signed)
Discussed strategies around weight loss.  She previously worked with Dr. Loyal Gambler, bariatric physician and then did blue sky.  She did really well on phentermine previously.  She is gained a significant amount of weight back since COVID started and would like to really get back on track.  She is also using noon which is fantastic.  Current weight: 196 pounds Previous weight: Change in weight: Nutrition goal: Continue work on portion control using the interactive app called Noom Exercise goal has started walking recently Medication: We will start phentermine 37.5 mg, half a tab daily.  Follow-up: 4 weeks for nurse visit for weight check.

## 2019-08-07 LAB — COMPLETE METABOLIC PANEL WITH GFR
AG Ratio: 1.5 (calc) (ref 1.0–2.5)
ALT: 10 U/L (ref 6–29)
AST: 14 U/L (ref 10–30)
Albumin: 4.1 g/dL (ref 3.6–5.1)
Alkaline phosphatase (APISO): 64 U/L (ref 31–125)
BUN: 15 mg/dL (ref 7–25)
CO2: 21 mmol/L (ref 20–32)
Calcium: 9.5 mg/dL (ref 8.6–10.2)
Chloride: 105 mmol/L (ref 98–110)
Creat: 0.8 mg/dL (ref 0.50–1.10)
GFR, Est African American: 107 mL/min/{1.73_m2} (ref >=60)
GFR, Est Non African American: 92 mL/min/{1.73_m2} (ref >=60)
Globulin: 2.7 g/dL (calc) (ref 1.9–3.7)
Glucose, Bld: 95 mg/dL (ref 65–139)
Potassium: 4.1 mmol/L (ref 3.5–5.3)
Sodium: 136 mmol/L (ref 135–146)
Total Bilirubin: 0.3 mg/dL (ref 0.2–1.2)
Total Protein: 6.8 g/dL (ref 6.1–8.1)

## 2019-08-07 LAB — LIPID PANEL W/REFLEX DIRECT LDL
Cholesterol: 172 mg/dL (ref <200)
HDL: 62 mg/dL (ref >=50)
LDL Cholesterol (Calc): 74 mg/dL (calc)
Non-HDL Cholesterol (Calc): 110 mg/dL (calc) (ref <130)
Total CHOL/HDL Ratio: 2.8 (calc) (ref <5.0)
Triglycerides: 302 mg/dL — ABNORMAL HIGH (ref <150)

## 2019-08-21 ENCOUNTER — Telehealth: Payer: Self-pay | Admitting: Family Medicine

## 2019-08-21 NOTE — Telephone Encounter (Signed)
Received a fax from [employer] that Vyvanse has been approved from 08/21/2019 through 08/20/2020 or until coverage is no longer available. Forms sent to scan. Pharmacy aware.

## 2019-09-03 ENCOUNTER — Other Ambulatory Visit: Payer: Self-pay

## 2019-09-03 ENCOUNTER — Ambulatory Visit (INDEPENDENT_AMBULATORY_CARE_PROVIDER_SITE_OTHER): Payer: 59 | Admitting: Family Medicine

## 2019-09-03 DIAGNOSIS — R635 Abnormal weight gain: Secondary | ICD-10-CM

## 2019-09-03 NOTE — Progress Notes (Signed)
Left brief VM for the patient with PCP recommendations. Patient was asked to call back.

## 2019-09-03 NOTE — Progress Notes (Signed)
Patient presents to clinic for a weight loss after starting phentermine. She reports no negative side effects. She is going to make a 1 month follow up for a weight check. She is tolerating the medication well. Patient is excited about her weight loss journey. No other concerns.

## 2019-09-03 NOTE — Progress Notes (Signed)
Agree with documentation as above.  According to scale she did not lose weight in fact she went up 2 pounds.  Please call patient and just verify this information.  Because if she is gaining weight on the medication then that means it is not effective.  Beatrice Lecher, MD

## 2019-09-04 ENCOUNTER — Encounter: Payer: Self-pay | Admitting: Family Medicine

## 2019-09-04 MED ORDER — PHENTERMINE HCL 37.5 MG PO TABS: 37.5000 mg | ORAL_TABLET | Freq: Every day | ORAL | 0 refills | Status: DC

## 2019-09-04 NOTE — Progress Notes (Signed)
I called the patient and she reports that someone had weighed her previously and it was not an accurate weight. She reports that she had been more than that but was not sure what the exact weight was. Patient is aware that she will have to be diligent in taking the medication daily and adding exercise as tolerated. She is willing to try another month. Please advise.

## 2019-10-02 ENCOUNTER — Ambulatory Visit (INDEPENDENT_AMBULATORY_CARE_PROVIDER_SITE_OTHER): Payer: 59 | Admitting: Osteopathic Medicine

## 2019-10-02 ENCOUNTER — Other Ambulatory Visit: Payer: Self-pay

## 2019-10-02 DIAGNOSIS — R635 Abnormal weight gain: Secondary | ICD-10-CM

## 2019-10-02 MED ORDER — PHENTERMINE HCL 37.5 MG PO TABS: 37.5000 mg | ORAL_TABLET | Freq: Every day | ORAL | 0 refills | Status: DC

## 2019-10-02 NOTE — Progress Notes (Signed)
Pt in today for blood pressure and weight check. Patient denies trouble sleeping, palpitations or medication problems. Patients last weight was 197 lb , todays weight is 191 lb . A refill for phentermine will be faxed to pt's pharmacy. Pt advised to schedule a follow up with nurse/provider in 30 days.

## 2019-10-30 ENCOUNTER — Ambulatory Visit: Payer: 59

## 2019-11-27 ENCOUNTER — Ambulatory Visit: Payer: 59

## 2020-01-12 ENCOUNTER — Other Ambulatory Visit: Payer: Self-pay | Admitting: Family Medicine

## 2020-01-12 DIAGNOSIS — F411 Generalized anxiety disorder: Secondary | ICD-10-CM

## 2020-01-12 DIAGNOSIS — F341 Dysthymic disorder: Secondary | ICD-10-CM

## 2020-04-15 ENCOUNTER — Other Ambulatory Visit: Payer: Self-pay | Admitting: *Deleted

## 2020-04-15 DIAGNOSIS — F411 Generalized anxiety disorder: Secondary | ICD-10-CM

## 2020-04-15 DIAGNOSIS — F341 Dysthymic disorder: Secondary | ICD-10-CM

## 2020-04-15 MED ORDER — BUPROPION HCL ER (XL) 300 MG PO TB24: 300.0000 mg | ORAL_TABLET | Freq: Every day | ORAL | 0 refills | Status: DC

## 2020-04-16 ENCOUNTER — Other Ambulatory Visit: Payer: Self-pay | Admitting: Family Medicine

## 2020-04-16 DIAGNOSIS — F411 Generalized anxiety disorder: Secondary | ICD-10-CM

## 2020-04-16 DIAGNOSIS — F341 Dysthymic disorder: Secondary | ICD-10-CM

## 2020-04-29 ENCOUNTER — Other Ambulatory Visit: Payer: Self-pay | Admitting: Family Medicine

## 2020-04-29 DIAGNOSIS — F341 Dysthymic disorder: Secondary | ICD-10-CM

## 2020-04-29 DIAGNOSIS — F411 Generalized anxiety disorder: Secondary | ICD-10-CM

## 2020-05-21 ENCOUNTER — Other Ambulatory Visit: Payer: Self-pay

## 2020-05-21 ENCOUNTER — Encounter: Payer: Self-pay | Admitting: Family Medicine

## 2020-05-21 ENCOUNTER — Ambulatory Visit (INDEPENDENT_AMBULATORY_CARE_PROVIDER_SITE_OTHER): Payer: 59 | Admitting: Family Medicine

## 2020-05-21 VITALS — BP 109/68 | HR 81 | Ht 65.0 in | Wt 193.0 lb

## 2020-05-21 DIAGNOSIS — F988 Other specified behavioral and emotional disorders with onset usually occurring in childhood and adolescence: Secondary | ICD-10-CM

## 2020-05-21 DIAGNOSIS — E282 Polycystic ovarian syndrome: Secondary | ICD-10-CM | POA: Diagnosis not present

## 2020-05-21 DIAGNOSIS — R635 Abnormal weight gain: Secondary | ICD-10-CM

## 2020-05-21 DIAGNOSIS — R7301 Impaired fasting glucose: Secondary | ICD-10-CM

## 2020-05-21 DIAGNOSIS — F341 Dysthymic disorder: Secondary | ICD-10-CM | POA: Diagnosis not present

## 2020-05-21 DIAGNOSIS — F411 Generalized anxiety disorder: Secondary | ICD-10-CM

## 2020-05-21 MED ORDER — CLONAZEPAM 1 MG PO TABS: ORAL_TABLET | ORAL | 1 refills | Status: DC

## 2020-05-21 MED ORDER — BUPROPION HCL ER (XL) 300 MG PO TB24: 300.0000 mg | ORAL_TABLET | Freq: Every day | ORAL | 1 refills | Status: DC

## 2020-05-21 MED ORDER — PHENTERMINE HCL 37.5 MG PO TABS: 37.5000 mg | ORAL_TABLET | Freq: Every day | ORAL | 0 refills | Status: DC

## 2020-05-21 NOTE — Assessment & Plan Note (Signed)
With current regimen of Wellbutrin and as needed clonazepam.  Continue to use benzodiazepines sparingly.  Follow-up in 6 months.

## 2020-05-21 NOTE — Assessment & Plan Note (Signed)
Due for hemoglobin A1c but we do not have kits today.  We will get blood work performed in October.  Printed lab slip today.
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
## 2020-05-21 NOTE — Assessment & Plan Note (Signed)
Discussed options.  We will restart phentermine.  She is currently using my fitness pal and has been doing some weight training but plans on adding some cardio and.

## 2020-05-21 NOTE — Assessment & Plan Note (Signed)
She is decided to not take a medication for treatment.  She says usually she just drinks a couple cups of coffee in the morning and finds it helpful.  So will hold off for now went ahead and removed Vyvanse from her medication list.

## 2020-06-19 ENCOUNTER — Ambulatory Visit: Payer: 59

## 2020-11-25 ENCOUNTER — Ambulatory Visit: Payer: 59 | Admitting: Family Medicine

## 2020-12-02 ENCOUNTER — Encounter: Payer: Self-pay | Admitting: Family Medicine

## 2020-12-02 ENCOUNTER — Other Ambulatory Visit: Payer: Self-pay

## 2020-12-02 ENCOUNTER — Ambulatory Visit (INDEPENDENT_AMBULATORY_CARE_PROVIDER_SITE_OTHER): Payer: 59 | Admitting: Family Medicine

## 2020-12-02 VITALS — BP 127/83 | HR 96 | Ht 65.0 in | Wt 194.0 lb

## 2020-12-02 DIAGNOSIS — R7301 Impaired fasting glucose: Secondary | ICD-10-CM

## 2020-12-02 DIAGNOSIS — F341 Dysthymic disorder: Secondary | ICD-10-CM

## 2020-12-02 DIAGNOSIS — M5441 Lumbago with sciatica, right side: Secondary | ICD-10-CM | POA: Insufficient documentation

## 2020-12-02 DIAGNOSIS — Z13220 Encounter for screening for lipoid disorders: Secondary | ICD-10-CM

## 2020-12-02 DIAGNOSIS — E611 Iron deficiency: Secondary | ICD-10-CM

## 2020-12-02 DIAGNOSIS — F411 Generalized anxiety disorder: Secondary | ICD-10-CM

## 2020-12-02 DIAGNOSIS — F988 Other specified behavioral and emotional disorders with onset usually occurring in childhood and adolescence: Secondary | ICD-10-CM

## 2020-12-02 DIAGNOSIS — L853 Xerosis cutis: Secondary | ICD-10-CM

## 2020-12-02 MED ORDER — METFORMIN HCL ER 500 MG PO TB24
500.0000 mg | ORAL_TABLET | Freq: Every day | ORAL | 1 refills | Status: DC
Start: 2020-12-02 — End: 2021-06-01

## 2020-12-02 MED ORDER — CLONAZEPAM 1 MG PO TABS: ORAL_TABLET | ORAL | 1 refills | Status: DC

## 2020-12-02 MED ORDER — PREDNISONE 20 MG PO TABS: 40.0000 mg | ORAL_TABLET | Freq: Every day | ORAL | 0 refills | Status: DC

## 2020-12-02 MED ORDER — BUPROPION HCL ER (XL) 150 MG PO TB24: 150.0000 mg | ORAL_TABLET | ORAL | 1 refills | Status: DC

## 2020-12-02 MED ORDER — LISDEXAMFETAMINE DIMESYLATE 20 MG PO CAPS: 20.0000 mg | ORAL_CAPSULE | Freq: Every day | ORAL | 0 refills | Status: DC

## 2020-12-02 NOTE — Assessment & Plan Note (Signed)
She is changed jobs she would like to restart her Vyvanse.  She has not been on it for several years.  New prescription sent to pharmacy.  She will just use it as needed.

## 2020-12-02 NOTE — Patient Instructions (Signed)
We will increase your Wellbutrin to 450 mg total.  So take a 300 mg capsule and 150 mg capsule daily.

## 2020-12-02 NOTE — Assessment & Plan Note (Signed)
Overdue for A1C check.

## 2020-12-02 NOTE — Assessment & Plan Note (Signed)
PHQ-9 score went from 7-13 today and GAD-7 score went from 6-9 today.  We did discuss options.  Taking 600 mg of Wellbutrin is way too much.  We did discuss increasing her to 450 mg total.  If she is tolerating that well then I will see her back in 6 months.  She can always taper back down to 300 mg daily if she is doing really well.

## 2020-12-02 NOTE — Assessment & Plan Note (Signed)
Discussed acute treatment with a trial of prednisone and putting her into formal physical therapy.  If she is not responding well and getting some relief then recommend more formal evaluation with possible x-rays and get her in with one of our sports med docs.  Hopefully she will respond well and be able to go back to her baseline with some physical therapy.
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
## 2020-12-04 LAB — CBC
HCT: 36.5 % (ref 35.0–45.0)
Hemoglobin: 12.3 g/dL (ref 11.7–15.5)
MCH: 31.3 pg (ref 27.0–33.0)
MCHC: 33.7 g/dL (ref 32.0–36.0)
MCV: 92.9 fL (ref 80.0–100.0)
MPV: 12.3 fL (ref 7.5–12.5)
Platelets: 247 10*3/uL (ref 140–400)
RBC: 3.93 10*6/uL (ref 3.80–5.10)
RDW: 11.7 % (ref 11.0–15.0)
WBC: 6.9 10*3/uL (ref 3.8–10.8)

## 2020-12-04 LAB — COMPLETE METABOLIC PANEL WITH GFR
AG Ratio: 1.6 (calc) (ref 1.0–2.5)
ALT: 8 U/L (ref 6–29)
AST: 12 U/L (ref 10–30)
Albumin: 4.2 g/dL (ref 3.6–5.1)
Alkaline phosphatase (APISO): 59 U/L (ref 31–125)
BUN: 14 mg/dL (ref 7–25)
CO2: 19 mmol/L — ABNORMAL LOW (ref 20–32)
Calcium: 9.5 mg/dL (ref 8.6–10.2)
Chloride: 106 mmol/L (ref 98–110)
Creat: 0.83 mg/dL (ref 0.50–1.10)
GFR, Est African American: 102 mL/min/{1.73_m2} (ref >=60)
GFR, Est Non African American: 88 mL/min/{1.73_m2} (ref >=60)
Globulin: 2.7 g/dL (calc) (ref 1.9–3.7)
Glucose, Bld: 100 mg/dL — ABNORMAL HIGH (ref 65–99)
Potassium: 4.5 mmol/L (ref 3.5–5.3)
Sodium: 137 mmol/L (ref 135–146)
Total Bilirubin: 0.3 mg/dL (ref 0.2–1.2)
Total Protein: 6.9 g/dL (ref 6.1–8.1)

## 2020-12-04 LAB — LIPID PANEL
Cholesterol: 180 mg/dL (ref <200)
HDL: 62 mg/dL (ref >=50)
LDL Cholesterol (Calc): 99 mg/dL (calc)
Non-HDL Cholesterol (Calc): 118 mg/dL (calc) (ref <130)
Total CHOL/HDL Ratio: 2.9 (calc) (ref <5.0)
Triglycerides: 94 mg/dL (ref <150)

## 2020-12-04 LAB — HEMOGLOBIN A1C
Hgb A1c MFr Bld: 5.5 % of total Hgb (ref <5.7)
Mean Plasma Glucose: 111 mg/dL
eAG (mmol/L): 6.2 mmol/L

## 2020-12-04 LAB — TSH: TSH: 1.12 mIU/L

## 2020-12-04 NOTE — Progress Notes (Signed)
All labs are normal. 

## 2020-12-08 ENCOUNTER — Encounter: Payer: Self-pay | Admitting: Physical Therapy

## 2020-12-08 ENCOUNTER — Ambulatory Visit (INDEPENDENT_AMBULATORY_CARE_PROVIDER_SITE_OTHER): Payer: 59 | Admitting: Physical Therapy

## 2020-12-08 ENCOUNTER — Other Ambulatory Visit: Payer: Self-pay

## 2020-12-08 DIAGNOSIS — R6889 Other general symptoms and signs: Secondary | ICD-10-CM | POA: Diagnosis not present

## 2020-12-08 DIAGNOSIS — M6281 Muscle weakness (generalized): Secondary | ICD-10-CM | POA: Diagnosis not present

## 2020-12-08 DIAGNOSIS — M5431 Sciatica, right side: Secondary | ICD-10-CM | POA: Diagnosis not present

## 2020-12-08 NOTE — Patient Instructions (Signed)
Access Code: [email or web address] URL: [email or web address] Date: 12/08/2020 Prepared by: Reggy Eye  Exercises Seated Sciatic Tensioner - 1 x daily - 7 x weekly - 3 sets - 10 reps Supine Transversus Abdominis Bracing - Hands on Stomach - 1 x daily - 7 x weekly - 2 sets - 10 reps - 3-5 seconds hold Supine Transversus Abdominis Bracing with Double Leg Fallout - 1 x daily - 7 x weekly - 2 sets - 10 reps
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
## 2020-12-15 ENCOUNTER — Encounter: Payer: 59 | Admitting: Physical Therapy

## 2020-12-16 ENCOUNTER — Ambulatory Visit: Payer: 59 | Admitting: Sports Medicine

## 2020-12-18 ENCOUNTER — Ambulatory Visit: Payer: 59 | Admitting: Physical Therapy

## 2020-12-18 ENCOUNTER — Other Ambulatory Visit: Payer: Self-pay

## 2020-12-18 ENCOUNTER — Encounter: Payer: 59 | Admitting: Physical Therapy

## 2020-12-18 DIAGNOSIS — R6889 Other general symptoms and signs: Secondary | ICD-10-CM | POA: Diagnosis not present

## 2020-12-18 DIAGNOSIS — M5431 Sciatica, right side: Secondary | ICD-10-CM | POA: Diagnosis not present

## 2020-12-18 DIAGNOSIS — M6281 Muscle weakness (generalized): Secondary | ICD-10-CM

## 2020-12-18 NOTE — Patient Instructions (Signed)
Access Code: [email or web address] URL: [email or web address] Date: 12/18/2020 Prepared by: Reggy Eye  Exercises Supine Transversus Abdominis Bracing - Hands on Stomach - 1 x daily - 7 x weekly - 2 sets - 10 reps - 3-5 seconds hold Supine Transversus Abdominis Bracing with Double Leg Fallout - 1 x daily - 7 x weekly - 2 sets - 10 reps Supine Pelvic Tilt - 1 x daily - 7 x weekly - 1 sets - 10 reps - 3-5 seconds hold Supine March with Posterior Pelvic Tilt - 1 x daily - 7 x weekly - 2 sets - 10 reps Clamshell - 1 x daily - 7 x weekly - 3 sets - 10 reps Sidelying Hip Abduction - 1 x daily - 7 x weekly - 3 sets - 10 reps
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
## 2020-12-23 ENCOUNTER — Other Ambulatory Visit: Payer: Self-pay

## 2020-12-23 ENCOUNTER — Ambulatory Visit: Payer: 59 | Admitting: Physical Therapy

## 2020-12-23 DIAGNOSIS — R6889 Other general symptoms and signs: Secondary | ICD-10-CM | POA: Diagnosis not present

## 2020-12-23 DIAGNOSIS — M5431 Sciatica, right side: Secondary | ICD-10-CM

## 2020-12-23 DIAGNOSIS — M6281 Muscle weakness (generalized): Secondary | ICD-10-CM

## 2020-12-23 NOTE — Patient Instructions (Signed)
Access Code: [email or web address] URL: [email or web address] Date: 12/23/2020 Prepared by: Reggy Eye  Exercises Supine Transversus Abdominis Bracing - Hands on Stomach - 1 x daily - 7 x weekly - 2 sets - 10 reps - 3-5 seconds hold Supine Transversus Abdominis Bracing with Double Leg Fallout - 1 x daily - 7 x weekly - 2 sets - 10 reps Supine Pelvic Tilt - 1 x daily - 7 x weekly - 1 sets - 10 reps - 3-5 seconds hold Supine March with Posterior Pelvic Tilt - 1 x daily - 7 x weekly - 2 sets - 10 reps Clamshell - 1 x daily - 7 x weekly - 3 sets - 10 reps Sidelying Hip Abduction - 1 x daily - 7 x weekly - 3 sets - 10 reps Hooklying Hamstring Stretch with Strap - 1 x daily - 7 x weekly - 3 sets - 1 reps - 20-30 seconds hold Supine Piriformis Stretch with Leg Straight - 1 x daily - 7 x weekly - 3 sets - 1 reps - 20-30 sec hold
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
## 2021-01-01 ENCOUNTER — Encounter: Payer: 59 | Admitting: Physical Therapy

## 2021-01-07 ENCOUNTER — Encounter: Payer: Self-pay | Admitting: Rehabilitative and Restorative Service Providers"

## 2021-01-07 ENCOUNTER — Ambulatory Visit: Payer: 59 | Admitting: Rehabilitative and Restorative Service Providers"

## 2021-01-07 ENCOUNTER — Other Ambulatory Visit: Payer: Self-pay

## 2021-01-07 DIAGNOSIS — R6889 Other general symptoms and signs: Secondary | ICD-10-CM | POA: Diagnosis not present

## 2021-01-07 DIAGNOSIS — M5431 Sciatica, right side: Secondary | ICD-10-CM

## 2021-01-07 DIAGNOSIS — M6281 Muscle weakness (generalized): Secondary | ICD-10-CM
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
## 2021-01-07 NOTE — Patient Instructions (Addendum)
Trigger Point Dry Needling  . What is Trigger Point Dry Needling (DN)? o DN is a physical therapy technique used to treat muscle pain and dysfunction. Specifically, DN helps deactivate muscle trigger points (muscle knots).  o A thin filiform needle is used to penetrate the skin and stimulate the underlying trigger point. The goal is for a local twitch response (LTR) to occur and for the trigger point to relax. No medication of any kind is injected during the procedure.   . What Does Trigger Point Dry Needling Feel Like?  o The procedure feels different for each individual patient. Some patients report that they do not actually feel the needle enter the skin and overall the process is not painful. Very mild bleeding may occur. However, many patients feel a deep cramping in the muscle in which the needle was inserted. This is the local twitch response.   Marland Kitchen How Will I feel after the treatment? o Soreness is normal, and the onset of soreness may not occur for a few hours. Typically this soreness does not last longer than two days.  o Bruising is uncommon, however; ice can be used to decrease any possible bruising.  o In rare cases feeling tired or nauseous after the treatment is normal. In addition, your symptoms may get worse before they get better, this period will typically not last longer than 24 hours.   . What Can I do After My Treatment? o Increase your hydration by drinking more water for the next 24 hours. o You may place ice or heat on the areas treated that have become sore, however, do not use heat on inflamed or bruised areas. Heat often brings more relief post needling. o You can continue your regular activities, but vigorous activity is not recommended initially after the treatment for 24 hours. o DN is best combined with other physical therapy such as strengthening, stretching, and other therapies.   Access Code: [email or web address]: [email or web address]: 03/10/2022Prepared  by: Lacresia Darwish HoltExercises  Supine Transversus Abdominis Bracing - Hands on Stomach - 1 x daily - 7 x weekly - 2 sets - 10 reps - 3-5 seconds hold  Supine Transversus Abdominis Bracing with Double Leg Fallout - 1 x daily - 7 x weekly - 2 sets - 10 reps  Supine Pelvic Tilt - 1 x daily - 7 x weekly - 1 sets - 10 reps - 3-5 seconds hold  Supine March with Posterior Pelvic Tilt - 1 x daily - 7 x weekly - 2 sets - 10 reps  Clamshell - 1 x daily - 7 x weekly - 3 sets - 10 reps  Sidelying Hip Abduction - 1 x daily - 7 x weekly - 3 sets - 10 reps  Hooklying Hamstring Stretch with Strap - 1 x daily - 7 x weekly - 3 sets - 1 reps - 20-30 seconds hold  Supine Piriformis Stretch with Leg Straight - 1 x daily - 7 x weekly - 3 sets - 1 reps - 20-30 sec hold  Prone Press Up - 2 x daily - 7 x weekly - 1 sets - 10 reps - 2-3 sec hold

## 2021-01-14 ENCOUNTER — Encounter: Payer: 59 | Admitting: Rehabilitative and Restorative Service Providers"

## 2021-01-18 ENCOUNTER — Ambulatory Visit (INDEPENDENT_AMBULATORY_CARE_PROVIDER_SITE_OTHER): Payer: 59 | Admitting: Rehabilitative and Restorative Service Providers"

## 2021-01-18 ENCOUNTER — Other Ambulatory Visit: Payer: Self-pay

## 2021-01-18 ENCOUNTER — Encounter: Payer: Self-pay | Admitting: Rehabilitative and Restorative Service Providers"

## 2021-01-18 DIAGNOSIS — M5431 Sciatica, right side: Secondary | ICD-10-CM | POA: Diagnosis not present

## 2021-01-18 DIAGNOSIS — M6281 Muscle weakness (generalized): Secondary | ICD-10-CM | POA: Diagnosis not present

## 2021-01-18 DIAGNOSIS — R6889 Other general symptoms and signs: Secondary | ICD-10-CM | POA: Diagnosis not present

## 2021-01-18 NOTE — Patient Instructions (Signed)
Access Code: [email or web address]: [email or web address]: 03/21/2022Prepared by: Shunda Rabadi HoltExercises  Supine Transversus Abdominis Bracing - Hands on Stomach - 1 x daily - 7 x weekly - 2 sets - 10 reps - 3-5 seconds hold  Supine Transversus Abdominis Bracing with Double Leg Fallout - 1 x daily - 7 x weekly - 2 sets - 10 reps  Supine Pelvic Tilt - 1 x daily - 7 x weekly - 1 sets - 10 reps - 3-5 seconds hold  Supine March with Posterior Pelvic Tilt - 1 x daily - 7 x weekly - 2 sets - 10 reps  Clamshell - 1 x daily - 7 x weekly - 3 sets - 10 reps  Sidelying Hip Abduction - 1 x daily - 7 x weekly - 3 sets - 10 reps  Hooklying Hamstring Stretch with Strap - 1 x daily - 7 x weekly - 3 sets - 1 reps - 20-30 seconds hold  Supine Piriformis Stretch with Leg Straight - 1 x daily - 7 x weekly - 3 sets - 1 reps - 20-30 sec hold  Prone Press Up - 2 x daily - 7 x weekly - 1 sets - 10 reps - 2-3 sec hold  Supine Sciatic Nerve Glide - 2 x daily - 7 x weekly - 1 sets - 8-10 reps - 1-2 sec hold  Supine ITB Stretch with Strap - 2 x daily - 7 x weekly - 1 sets - 3 reps - 30 sec hold  Sit to Stand - 2 x daily - 7 x weekly - 1 sets - 10 reps - 3-5 sec hold Trigger Point Dry Needling  . What is Trigger Point Dry Needling (DN)? o DN is a physical therapy technique used to treat muscle pain and dysfunction. Specifically, DN helps deactivate muscle trigger points (muscle knots).  o A thin filiform needle is used to penetrate the skin and stimulate the underlying trigger point. The goal is for a local twitch response (LTR) to occur and for the trigger point to relax. No medication of any kind is injected during the procedure.   . What Does Trigger Point Dry Needling Feel Like?  o The procedure feels different for each individual patient. Some patients report that they do not actually feel the needle enter the skin and overall the process is not painful. Very mild bleeding may occur. However, many patients feel a  deep cramping in the muscle in which the needle was inserted. This is the local twitch response.   Marland Kitchen How Will I feel after the treatment? o Soreness is normal, and the onset of soreness may not occur for a few hours. Typically this soreness does not last longer than two days.  o Bruising is uncommon, however; ice can be used to decrease any possible bruising.  o In rare cases feeling tired or nauseous after the treatment is normal. In addition, your symptoms may get worse before they get better, this period will typically not last longer than 24 hours.   . What Can I do After My Treatment? o Increase your hydration by drinking more water for the next 24 hours. o You may place ice or heat on the areas treated that have become sore, however, do not use heat on inflamed or bruised areas. Heat often brings more relief post needling. o You can continue your regular activities, but vigorous activity is not recommended initially after the treatment for 24 hours. o DN is best combined with other physical therapy such as strengthening,  stretching, and other therapies.
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
## 2021-01-28 ENCOUNTER — Ambulatory Visit: Payer: 59 | Admitting: Rehabilitative and Restorative Service Providers"

## 2021-01-28 ENCOUNTER — Encounter: Payer: Self-pay | Admitting: Rehabilitative and Restorative Service Providers"

## 2021-01-28 ENCOUNTER — Other Ambulatory Visit: Payer: Self-pay

## 2021-01-28 DIAGNOSIS — R6889 Other general symptoms and signs: Secondary | ICD-10-CM

## 2021-01-28 DIAGNOSIS — M5431 Sciatica, right side: Secondary | ICD-10-CM | POA: Diagnosis not present

## 2021-01-28 DIAGNOSIS — M6281 Muscle weakness (generalized): Secondary | ICD-10-CM | POA: Diagnosis not present
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
## 2021-01-28 NOTE — Patient Instructions (Signed)
Access Code: [email or web address]: [email or web address]: 03/31/2022Prepared by: Bethanie Bloxom HoltExercises  Supine Transversus Abdominis Bracing - Hands on Stomach - 1 x daily - 7 x weekly - 2 sets - 10 reps - 3-5 seconds hold  Supine Transversus Abdominis Bracing with Double Leg Fallout - 1 x daily - 7 x weekly - 2 sets - 10 reps  Supine Pelvic Tilt - 1 x daily - 7 x weekly - 1 sets - 10 reps - 3-5 seconds hold  Supine March with Posterior Pelvic Tilt - 1 x daily - 7 x weekly - 2 sets - 10 reps  Clamshell - 1 x daily - 7 x weekly - 3 sets - 10 reps  Sidelying Hip Abduction - 1 x daily - 7 x weekly - 3 sets - 10 reps  Hooklying Hamstring Stretch with Strap - 1 x daily - 7 x weekly - 3 sets - 1 reps - 20-30 seconds hold  Supine Piriformis Stretch with Leg Straight - 1 x daily - 7 x weekly - 3 sets - 1 reps - 20-30 sec hold  Prone Press Up - 2 x daily - 7 x weekly - 1 sets - 10 reps - 2-3 sec hold  Supine Sciatic Nerve Glide - 2 x daily - 7 x weekly - 1 sets - 8-10 reps - 1-2 sec hold  Supine ITB Stretch with Strap - 2 x daily - 7 x weekly - 1 sets - 3 reps - 30 sec hold  Sit to Stand - 2 x daily - 7 x weekly - 1 sets - 10 reps - 3-5 sec hold  Anti-Rotation Lateral Stepping with Press - 2 x daily - 7 x weekly - 1-2 sets - 10 reps - 2-3 sec hold  Seated Bilateral Shoulder External Rotation with Resistance - 2 x daily - 7 x weekly - 1 sets - 10 reps - 3 sec hold

## 2021-02-03 ENCOUNTER — Encounter: Payer: Self-pay | Admitting: Rehabilitative and Restorative Service Providers"

## 2021-02-03 ENCOUNTER — Ambulatory Visit: Payer: 59 | Admitting: Rehabilitative and Restorative Service Providers"

## 2021-02-03 ENCOUNTER — Other Ambulatory Visit: Payer: Self-pay

## 2021-02-03 DIAGNOSIS — R6889 Other general symptoms and signs: Secondary | ICD-10-CM

## 2021-02-03 DIAGNOSIS — M5431 Sciatica, right side: Secondary | ICD-10-CM

## 2021-02-03 DIAGNOSIS — M6281 Muscle weakness (generalized): Secondary | ICD-10-CM
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
## 2021-02-03 NOTE — Patient Instructions (Signed)
Access Code: [email or web address]: [email or web address]: 04/06/2022Prepared by: Nikie Cid HoltExercises  Supine Transversus Abdominis Bracing - Hands on Stomach - 1 x daily - 7 x weekly - 2 sets - 10 reps - 3-5 seconds hold  Supine Transversus Abdominis Bracing with Double Leg Fallout - 1 x daily - 7 x weekly - 2 sets - 10 reps  Supine Pelvic Tilt - 1 x daily - 7 x weekly - 1 sets - 10 reps - 3-5 seconds hold  Supine March with Posterior Pelvic Tilt - 1 x daily - 7 x weekly - 2 sets - 10 reps  Clamshell - 1 x daily - 7 x weekly - 3 sets - 10 reps  Sidelying Hip Abduction - 1 x daily - 7 x weekly - 3 sets - 10 reps  Hooklying Hamstring Stretch with Strap - 1 x daily - 7 x weekly - 3 sets - 1 reps - 20-30 seconds hold  Supine Piriformis Stretch with Leg Straight - 1 x daily - 7 x weekly - 3 sets - 1 reps - 20-30 sec hold  Prone Press Up - 2 x daily - 7 x weekly - 1 sets - 10 reps - 2-3 sec hold  Supine Sciatic Nerve Glide - 2 x daily - 7 x weekly - 1 sets - 8-10 reps - 1-2 sec hold  Supine ITB Stretch with Strap - 2 x daily - 7 x weekly - 1 sets - 3 reps - 30 sec hold  Sit to Stand - 2 x daily - 7 x weekly - 1 sets - 10 reps - 3-5 sec hold  Anti-Rotation Lateral Stepping with Press - 2 x daily - 7 x weekly - 1-2 sets - 10 reps - 2-3 sec hold  Seated Bilateral Shoulder External Rotation with Resistance - 2 x daily - 7 x weekly - 1 sets - 10 reps - 3 sec hold  Standing Isometric Hip Abduction with Mini Squat and Ball on Wall - 2 x daily - 7 x weekly - 1 sets - 10 reps - 3 sec hold

## 2021-02-09 ENCOUNTER — Encounter: Payer: 59 | Admitting: Rehabilitative and Restorative Service Providers"

## 2021-02-17 ENCOUNTER — Other Ambulatory Visit: Payer: Self-pay | Admitting: Family Medicine

## 2021-02-17 DIAGNOSIS — F411 Generalized anxiety disorder: Secondary | ICD-10-CM

## 2021-02-17 DIAGNOSIS — F341 Dysthymic disorder: Secondary | ICD-10-CM

## 2021-04-09 ENCOUNTER — Encounter: Payer: Self-pay | Admitting: Family Medicine

## 2021-04-09 ENCOUNTER — Other Ambulatory Visit: Payer: Self-pay

## 2021-04-09 ENCOUNTER — Ambulatory Visit: Payer: 59 | Admitting: Family Medicine

## 2021-04-09 VITALS — BP 138/85 | HR 100 | Temp 98.1°F | Ht 65.0 in | Wt 187.0 lb

## 2021-04-09 DIAGNOSIS — J029 Acute pharyngitis, unspecified: Secondary | ICD-10-CM | POA: Diagnosis not present

## 2021-04-09 DIAGNOSIS — R52 Pain, unspecified: Secondary | ICD-10-CM

## 2021-04-09 DIAGNOSIS — H60331 Swimmer's ear, right ear: Secondary | ICD-10-CM

## 2021-04-09 DIAGNOSIS — R0981 Nasal congestion: Secondary | ICD-10-CM

## 2021-04-09 DIAGNOSIS — R059 Cough, unspecified: Secondary | ICD-10-CM | POA: Diagnosis not present

## 2021-04-09 DIAGNOSIS — J019 Acute sinusitis, unspecified: Secondary | ICD-10-CM

## 2021-04-09 LAB — POCT RAPID STREP A (OFFICE): Rapid Strep A Screen: NEGATIVE

## 2021-04-09 MED ORDER — HYDROCODONE BIT-HOMATROP MBR 5-1.5 MG/5ML PO SOLN: 5.0000 mL | Freq: Every evening | ORAL | 0 refills | Status: DC | PRN

## 2021-04-09 MED ORDER — HYDROCODONE BIT-HOMATROP MBR 5-1.5 MG/5ML PO SOLN
5.0000 mL | Freq: Every evening | ORAL | 0 refills | Status: DC | PRN
Start: 2021-04-09 — End: 2021-05-19

## 2021-04-09 MED ORDER — NEOMYCIN-POLYMYXIN-HC 3.5-10000-1 OT SUSP
3.0000 [drp] | Freq: Four times a day (QID) | OTIC | 0 refills | Status: DC
Start: 2021-04-09 — End: 2021-05-19

## 2021-04-09 MED ORDER — AMOXICILLIN 875 MG PO TABS: 875.0000 mg | ORAL_TABLET | Freq: Two times a day (BID) | ORAL | 0 refills | Status: AC
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
## 2021-04-09 NOTE — Patient Instructions (Signed)
Please don't start the antibiotic until you get your COVID test back.

## 2021-04-11 LAB — SARS-COV-2, NAA 2 DAY TAT

## 2021-04-11 LAB — NOVEL CORONAVIRUS, NAA: SARS-CoV-2, NAA: NOT DETECTED

## 2021-04-11 LAB — SPECIMEN STATUS REPORT

## 2021-04-12 ENCOUNTER — Encounter: Payer: Self-pay | Admitting: Family Medicine

## 2021-04-13 ENCOUNTER — Ambulatory Visit: Payer: 59 | Admitting: Physician Assistant

## 2021-04-13 ENCOUNTER — Encounter: Payer: Self-pay | Admitting: Physician Assistant

## 2021-04-13 ENCOUNTER — Other Ambulatory Visit: Payer: Self-pay

## 2021-04-13 VITALS — BP 140/82 | HR 85 | Ht 65.0 in | Wt 189.0 lb

## 2021-04-13 DIAGNOSIS — H1033 Unspecified acute conjunctivitis, bilateral: Secondary | ICD-10-CM

## 2021-04-13 MED ORDER — POLYMYXIN B-TRIMETHOPRIM 10000-0.1 UNIT/ML-% OP SOLN: 1.0000 [drp] | OPHTHALMIC | 0 refills | Status: DC

## 2021-04-13 NOTE — Progress Notes (Signed)
   Subjective:    Patient ID: Taylor [name], female    DOB: 1981/04/23, 40 y.o.   MRN: [id number]  HPI Pt is a 40 yo female who presents to the clinic with bilateral red, irritated eyes with discharge for the last 3 days. She was seen on Friday and given amoxil and ofloxacin for sinusitis and otitis externa. She is feeling better except for her eyes. She denies any major vision changes but they do get blurry from time to time. She is wiping off green/yellow discharge multiple times a day. No fever, chills. She does have productive cough.   Pt does not wear contacts. No new make up.   .. Active Ambulatory Problems    Diagnosis Date Noted   ANXIETY DEPRESSION 10/22/2009   Attention deficit disorder 06/26/2009   IBS 05/25/2009   KNEE PAIN, RIGHT 02/03/2010   BACK PAIN, THORACIC REGION 07/20/2009   IMPAIRED FASTING GLUCOSE 07/20/2009   Iron deficiency 03/12/2012   PCOS (polycystic ovarian syndrome) 09/20/2013   Abnormal weight gain 08/06/2019   Hirsutism 12/13/2018   Obesity, Class I, BMI 30-34.9 10/02/2014   Acute back pain with sciatica, right 12/02/2020   Resolved Ambulatory Problems    Diagnosis Date Noted   EXUDATIVE PHARYNGITIS 03/15/2010   Prediabetes 12/13/2018   Past Medical History:  Diagnosis Date   Anxiety    Depression        Review of Systems See HPI.     Objective:   Physical Exam Vitals reviewed.  Constitutional:      Appearance: Normal appearance.  HENT:     Head: Normocephalic.     Right Ear: Tympanic membrane, ear canal and external ear normal. There is no impacted cerumen.     Left Ear: Tympanic membrane, ear canal and external ear normal. There is no impacted cerumen.     Nose: Nose normal.     Mouth/Throat:     Mouth: Mucous membranes are moist.  Eyes:     General:        Right eye: Discharge present.        Left eye: Discharge present.    Extraocular Movements: Extraocular movements intact.     Pupils: Pupils are equal, round, and reactive  to light.     Comments: Left worse than right with injected conjunctiva with green discharge.   Neurological:     Mental Status: She is alert.          Assessment & Plan:  Marland KitchenMarland KitchenDiagnoses and all orders for this visit:  Acute bacterial conjunctivitis of both eyes -     trimethoprim-polymyxin b (POLYTRIM) ophthalmic solution; Place 1 drop into both eyes every 4 (four) hours. For 7 days.  Painful more than itchy. Suspect more bacterial. No allergic trigger seems to be traveling. Start polytrim for 7 days. Warm compresses. HO given. Pt does work from home as to not spread. Change eye make up.

## 2021-05-19 ENCOUNTER — Other Ambulatory Visit: Payer: Self-pay | Admitting: Family Medicine

## 2021-05-19 ENCOUNTER — Other Ambulatory Visit: Payer: Self-pay | Admitting: *Deleted

## 2021-05-19 DIAGNOSIS — F341 Dysthymic disorder: Secondary | ICD-10-CM

## 2021-06-01 ENCOUNTER — Other Ambulatory Visit: Payer: Self-pay

## 2021-06-01 ENCOUNTER — Ambulatory Visit (INDEPENDENT_AMBULATORY_CARE_PROVIDER_SITE_OTHER): Payer: 59 | Admitting: Family Medicine

## 2021-06-01 ENCOUNTER — Encounter: Payer: Self-pay | Admitting: Family Medicine

## 2021-06-01 VITALS — BP 118/82 | HR 83 | Temp 98.6°F | Ht 65.0 in | Wt 193.0 lb

## 2021-06-01 DIAGNOSIS — R7301 Impaired fasting glucose: Secondary | ICD-10-CM | POA: Diagnosis not present

## 2021-06-01 DIAGNOSIS — F988 Other specified behavioral and emotional disorders with onset usually occurring in childhood and adolescence: Secondary | ICD-10-CM | POA: Diagnosis not present

## 2021-06-01 DIAGNOSIS — F341 Dysthymic disorder: Secondary | ICD-10-CM

## 2021-06-01 DIAGNOSIS — F411 Generalized anxiety disorder: Secondary | ICD-10-CM | POA: Diagnosis not present

## 2021-06-01 MED ORDER — METFORMIN HCL ER 500 MG PO TB24: 500.0000 mg | ORAL_TABLET | Freq: Every day | ORAL | 1 refills | Status: DC

## 2021-06-01 MED ORDER — LISDEXAMFETAMINE DIMESYLATE 20 MG PO CAPS: 20.0000 mg | ORAL_CAPSULE | Freq: Every day | ORAL | 0 refills | Status: DC

## 2021-06-01 MED ORDER — JUNEL FE 1/20 1-20 MG-MCG PO TABS: 1.0000 | ORAL_TABLET | Freq: Every day | ORAL | 11 refills | Status: DC

## 2021-06-01 MED ORDER — BUPROPION HCL ER (XL) 300 MG PO TB24: 300.0000 mg | ORAL_TABLET | Freq: Every day | ORAL | 1 refills | Status: DC

## 2021-06-01 MED ORDER — ESCITALOPRAM OXALATE 10 MG PO TABS: ORAL_TABLET | ORAL | 1 refills | Status: DC

## 2021-06-01 NOTE — Assessment & Plan Note (Signed)
Filled her metformin. Lab Results  Component Value Date   HGBA1C 5.5 12/03/2020

## 2021-06-01 NOTE — Assessment & Plan Note (Addendum)
Stable on current regimen.  Refill sent to pharmacy.

## 2021-06-01 NOTE — Assessment & Plan Note (Signed)
Discussed options.  We will try Lexapro.  Plan to see her back in about 7 weeks right before her second refill so that we can see how she is doing and make any adjustments needed or even consider switching medications if needed.  If this is not helpful or she has side effects then we will put her back on Zoloft which she took about 8 years ago.
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
## 2021-06-29 ENCOUNTER — Other Ambulatory Visit: Payer: Self-pay | Admitting: Family Medicine

## 2021-06-29 DIAGNOSIS — F341 Dysthymic disorder: Secondary | ICD-10-CM

## 2021-07-26 ENCOUNTER — Other Ambulatory Visit: Payer: Self-pay

## 2021-07-26 ENCOUNTER — Ambulatory Visit: Payer: 59 | Admitting: Family Medicine

## 2021-07-26 ENCOUNTER — Encounter: Payer: Self-pay | Admitting: Family Medicine

## 2021-07-26 VITALS — BP 118/71 | HR 79 | Ht 65.0 in | Wt 198.0 lb

## 2021-07-26 DIAGNOSIS — F341 Dysthymic disorder: Secondary | ICD-10-CM | POA: Diagnosis not present

## 2021-07-26 DIAGNOSIS — R7301 Impaired fasting glucose: Secondary | ICD-10-CM

## 2021-07-26 DIAGNOSIS — Z23 Encounter for immunization: Secondary | ICD-10-CM

## 2021-07-26 DIAGNOSIS — F988 Other specified behavioral and emotional disorders with onset usually occurring in childhood and adolescence: Secondary | ICD-10-CM

## 2021-07-26 LAB — POCT GLYCOSYLATED HEMOGLOBIN (HGB A1C): Hemoglobin A1C: 5 % (ref 4.0–5.6)

## 2021-07-26 MED ORDER — ESCITALOPRAM OXALATE 10 MG PO TABS: 10.0000 mg | ORAL_TABLET | Freq: Every day | ORAL | 0 refills | Status: DC

## 2021-07-26 MED ORDER — LISDEXAMFETAMINE DIMESYLATE 20 MG PO CAPS: 20.0000 mg | ORAL_CAPSULE | Freq: Every day | ORAL | 0 refills | Status: DC
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
## 2021-07-26 NOTE — Progress Notes (Signed)
Doing well on current regimen .  

## 2021-07-26 NOTE — Assessment & Plan Note (Signed)
A1c looks phenomenal today.  It was 5.5 and it came down to 5.0.  I think the routine exercise has been extremely helpful.

## 2021-07-26 NOTE — Assessment & Plan Note (Signed)
Significant improvement in depressive and anxiety symptoms on Lexapro.  We will continue with current regimen for now would like to see her back in about 3 to 4 months.  She does feel like the medication is increased her appetite and her weight is up about 5 pounds we discussed options.  She wants to give it about 2 more months to see if that levels off and if not and continues to increase appetite then we may need to look at changing medications.

## 2021-08-06 ENCOUNTER — Encounter: Payer: Self-pay | Admitting: Family Medicine

## 2021-08-06 NOTE — Progress Notes (Signed)
abstraction

## 2021-09-08 ENCOUNTER — Other Ambulatory Visit: Payer: Self-pay | Admitting: Family Medicine

## 2021-09-08 DIAGNOSIS — F341 Dysthymic disorder: Secondary | ICD-10-CM

## 2021-09-08 DIAGNOSIS — F411 Generalized anxiety disorder: Secondary | ICD-10-CM

## 2021-09-27 ENCOUNTER — Ambulatory Visit: Payer: 59 | Admitting: Family Medicine

## 2021-09-27 ENCOUNTER — Encounter: Payer: Self-pay | Admitting: Family Medicine

## 2021-09-27 VITALS — BP 129/70 | HR 92 | Ht 65.0 in | Wt 204.0 lb

## 2021-09-27 DIAGNOSIS — F341 Dysthymic disorder: Secondary | ICD-10-CM | POA: Diagnosis not present

## 2021-09-27 DIAGNOSIS — E669 Obesity, unspecified: Secondary | ICD-10-CM

## 2021-09-27 DIAGNOSIS — F988 Other specified behavioral and emotional disorders with onset usually occurring in childhood and adolescence: Secondary | ICD-10-CM | POA: Diagnosis not present

## 2021-09-27 MED ORDER — LISDEXAMFETAMINE DIMESYLATE 20 MG PO CAPS: 20.0000 mg | ORAL_CAPSULE | Freq: Every day | ORAL | 0 refills | Status: DC

## 2021-09-27 MED ORDER — LISDEXAMFETAMINE DIMESYLATE 20 MG PO CAPS
20.0000 mg | ORAL_CAPSULE | Freq: Every day | ORAL | 0 refills | Status: DC
Start: 2021-11-25 — End: 2021-12-14

## 2021-09-27 MED ORDER — CITALOPRAM HYDROBROMIDE 20 MG PO TABS: 20.0000 mg | ORAL_TABLET | Freq: Every day | ORAL | 1 refills | Status: DC
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
## 2021-09-27 NOTE — Assessment & Plan Note (Signed)
Discussed options.  Working to try switching to citalopram just to see if that makes a difference in the hunger drive that she has been getting with the Lexapro because she is otherwise been very happy with her medication.  If that is not helpful then could consider switching completely.  She did not do well with fluoxetine.

## 2021-09-27 NOTE — Assessment & Plan Note (Signed)
Well controlled. Continue current regimen. Follow up in  6 mo  

## 2021-09-27 NOTE — Assessment & Plan Note (Signed)
Continue to work on healthy diet and regular exercise. ?

## 2021-10-12 ENCOUNTER — Encounter: Payer: Self-pay | Admitting: Family Medicine

## 2021-10-12 ENCOUNTER — Ambulatory Visit: Payer: 59 | Admitting: Family Medicine

## 2021-10-12 ENCOUNTER — Other Ambulatory Visit: Payer: Self-pay

## 2021-10-12 VITALS — BP 139/88 | HR 85 | Resp 16 | Wt 203.0 lb

## 2021-10-12 DIAGNOSIS — R110 Nausea: Secondary | ICD-10-CM

## 2021-10-12 DIAGNOSIS — R140 Abdominal distension (gaseous): Secondary | ICD-10-CM | POA: Diagnosis not present

## 2021-10-12 DIAGNOSIS — R1013 Epigastric pain: Secondary | ICD-10-CM
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
## 2021-10-13 ENCOUNTER — Ambulatory Visit (INDEPENDENT_AMBULATORY_CARE_PROVIDER_SITE_OTHER): Payer: 59

## 2021-10-13 DIAGNOSIS — R110 Nausea: Secondary | ICD-10-CM

## 2021-10-13 DIAGNOSIS — R140 Abdominal distension (gaseous): Secondary | ICD-10-CM | POA: Diagnosis not present

## 2021-10-13 DIAGNOSIS — R1013 Epigastric pain: Secondary | ICD-10-CM | POA: Diagnosis not present

## 2021-10-13 LAB — COMPLETE METABOLIC PANEL WITH GFR
AG Ratio: 1.4 (calc) (ref 1.0–2.5)
ALT: 15 U/L (ref 6–29)
AST: 18 U/L (ref 10–30)
Albumin: 4.2 g/dL (ref 3.6–5.1)
Alkaline phosphatase (APISO): 64 U/L (ref 31–125)
BUN: 15 mg/dL (ref 7–25)
CO2: 23 mmol/L (ref 20–32)
Calcium: 9.6 mg/dL (ref 8.6–10.2)
Chloride: 103 mmol/L (ref 98–110)
Creat: 0.87 mg/dL (ref 0.50–0.99)
Globulin: 3 g/dL (calc) (ref 1.9–3.7)
Glucose, Bld: 75 mg/dL (ref 65–99)
Potassium: 4.4 mmol/L (ref 3.5–5.3)
Sodium: 136 mmol/L (ref 135–146)
Total Bilirubin: 0.4 mg/dL (ref 0.2–1.2)
Total Protein: 7.2 g/dL (ref 6.1–8.1)
eGFR: 85 mL/min/{1.73_m2} (ref >=60)

## 2021-10-13 LAB — CBC WITH DIFFERENTIAL/PLATELET
Absolute Monocytes: 502 cells/uL (ref 200–950)
Basophils Absolute: 94 cells/uL (ref 0–200)
Basophils Relative: 1.1 %
Eosinophils Absolute: 264 cells/uL (ref 15–500)
Eosinophils Relative: 3.1 %
HCT: 39.6 % (ref 35.0–45.0)
Hemoglobin: 13.2 g/dL (ref 11.7–15.5)
Lymphs Abs: 2856 cells/uL (ref 850–3900)
MCH: 31.8 pg (ref 27.0–33.0)
MCHC: 33.3 g/dL (ref 32.0–36.0)
MCV: 95.4 fL (ref 80.0–100.0)
MPV: 11.5 fL (ref 7.5–12.5)
Monocytes Relative: 5.9 %
Neutro Abs: 4786 cells/uL (ref 1500–7800)
Neutrophils Relative %: 56.3 %
Platelets: 305 10*3/uL (ref 140–400)
RBC: 4.15 10*6/uL (ref 3.80–5.10)
RDW: 11.7 % (ref 11.0–15.0)
Total Lymphocyte: 33.6 %
WBC: 8.5 10*3/uL (ref 3.8–10.8)

## 2021-10-13 LAB — LIPASE: Lipase: 24 U/L (ref 7–60)

## 2021-10-13 NOTE — Progress Notes (Signed)
Your lab work is within acceptable range and there are no concerning findings.   ?

## 2021-10-14 ENCOUNTER — Other Ambulatory Visit: Payer: Self-pay | Admitting: Family Medicine

## 2021-10-14 DIAGNOSIS — F341 Dysthymic disorder: Secondary | ICD-10-CM

## 2021-10-15 ENCOUNTER — Encounter: Payer: Self-pay | Admitting: Family Medicine

## 2021-10-15 DIAGNOSIS — K8000 Calculus of gallbladder with acute cholecystitis without obstruction: Secondary | ICD-10-CM

## 2021-10-15 NOTE — Progress Notes (Signed)
Hi Taylor [name], the gallbladder looks a little contracted and you do have gallstones as well as a little bit of wall thickening consistent with inflammation in the gallbladder.  In the note they recommended correlating with a nuclear scan.  I really do not think we need to do that I think we really need to just refer you to a general surgeon to discuss having your gallbladder removed.  If you are okay with that then please let me know we usually refer to [facility] surgery in [place] but if you have a preference for another provider or location then please let us know and we will go ahead and do that.  In the meantime just encourage you to eat a really low-fat and bland diet.  Stay away from greases and oils and higher fat content foods.

## 2021-10-27 ENCOUNTER — Other Ambulatory Visit: Payer: Self-pay | Admitting: Family Medicine

## 2021-12-07 ENCOUNTER — Ambulatory Visit: Payer: Self-pay | Admitting: General Surgery

## 2021-12-07 NOTE — Progress Notes (Addendum)
Surgical Instructions    Your procedure is scheduled on 12/14/21.  Report to [facility] Main Entrance "A" at 5:30 A.M., then check in with the Admitting office.  Call this number if you have problems the morning of surgery:  [phone]   If you have any questions prior to your surgery date call [phone]: Open Monday-Friday 8am-4pm    Remember:  Do not eat after midnight the night before your surgery  You may drink clear liquids until 4:30am the morning of your surgery.   Clear liquids allowed are: Water, Non-Citrus Juices (without pulp), Carbonated Beverages, Clear Tea, Black Coffee ONLY (NO MILK, CREAM OR POWDERED CREAMER of any kind), and Gatorade  Patient Instructions  The night before surgery:  No food after midnight. ONLY clear liquids after midnight  The day of surgery (if you do NOT have diabetes):  Drink ONE (1) Pre-Surgery Clear Ensure by 4:30am the morning of surgery. Drink in one sitting. Do not sip.  This drink was given to you during your hospital  pre-op appointment visit. Nothing else to drink after completing the  Pre-Surgery Clear Ensure.           If you have questions, please contact your surgeons office.     Take these medicines the morning of surgery with A SIP OF WATER:  buPROPion (WELLBUTRIN XL) Norethindrone-Ethinyl Estradiol-Fe Biphas (LO LOESTRIN FE)  omeprazole (PRILOSEC OTC)   IF NEEDED: clonazePAM (KLONOPIN) fluticasone (FLONASE) valACYclovir (VALTREX)   As of today, STOP taking any Aspirin (unless otherwise instructed by your surgeon) Aleve, Naproxen, Ibuprofen, Motrin, Advil, Goody's, BC's, all herbal medications, fish oil, and all vitamins.           Do not wear jewelry or makeup Do not wear lotions, powders, perfumes/colognes, or deodorant. Do not shave 48 hours prior to surgery.   Do not bring valuables to the hospital. Do not wear nail polish, gel polish, artificial nails, or any other type of covering on natural nails  (fingers and toes) If you have artificial nails or gel coating that need to be removed by a nail salon, please have this removed prior to surgery. Artificial nails or gel coating may interfere with anesthesia's ability to adequately monitor your vital signs.  [facility] is not responsible for any belongings or valuables. .   Do NOT Smoke (Tobacco/Vaping)  24 hours prior to your procedure  If you use a CPAP at night, you may bring your mask for your overnight stay.   Contacts, glasses, hearing aids, dentures or partials may not be worn into surgery, please bring cases for these belongings   For patients admitted to the hospital, discharge time will be determined by your treatment team.   Patients discharged the day of surgery will not be allowed to drive home, and someone needs to stay with them for 24 hours.  NO VISITORS WILL BE ALLOWED IN PRE-OP WHERE PATIENTS ARE PREPPED FOR SURGERY.  ONLY 1 SUPPORT PERSON MAY BE PRESENT IN THE WAITING ROOM WHILE YOU ARE IN SURGERY.  IF YOU ARE TO BE ADMITTED, ONCE YOU ARE IN YOUR ROOM YOU WILL BE ALLOWED TWO (2) VISITORS. 1 (ONE) VISITOR MAY STAY OVERNIGHT BUT MUST ARRIVE TO THE ROOM BY 8pm.  Minor children may have two parents present. Special consideration for safety and communication needs will be reviewed on a case by case basis.  Special instructions:    Oral Hygiene is also important to reduce your risk of infection.  Remember - BRUSH YOUR TEETH  THE MORNING OF SURGERY WITH YOUR REGULAR TOOTHPASTE   [facility]- Preparing For Surgery  Before surgery, you can play an important role. Because skin is not sterile, your skin needs to be as free of germs as possible. You can reduce the number of germs on your skin by washing with CHG (chlorahexidine gluconate) Soap before surgery.  CHG is an antiseptic cleaner which kills germs and bonds with the skin to continue killing germs even after washing.     Please do not use if you have an allergy to CHG or  antibacterial soaps. If your skin becomes reddened/irritated stop using the CHG.  Do not shave (including legs and underarms) for at least 48 hours prior to first CHG shower. It is OK to shave your face.  Please follow these instructions carefully.     Shower the NIGHT BEFORE SURGERY and the MORNING OF SURGERY with CHG Soap.   If you chose to wash your hair, wash your hair first as usual with your normal shampoo. After you shampoo, rinse your hair and body thoroughly to remove the shampoo.  Then [employer] and genitals (private parts) with your normal soap and rinse thoroughly to remove soap.  After that Use CHG Soap as you would any other liquid soap. You can apply CHG directly to the skin and wash gently with a scrungie or a clean washcloth.   Apply the CHG Soap to your body ONLY FROM THE NECK DOWN.  Do not use on open wounds or open sores. Avoid contact with your eyes, ears, mouth and genitals (private parts). Wash Face and genitals (private parts)  with your normal soap.   Wash thoroughly, paying special attention to the area where your surgery will be performed.  Thoroughly rinse your body with warm water from the neck down.  DO NOT shower/wash with your normal soap after using and rinsing off the CHG Soap.  Pat yourself dry with a CLEAN TOWEL.  Wear CLEAN PAJAMAS to bed the night before surgery  Place CLEAN SHEETS on your bed the night before your surgery  DO NOT SLEEP WITH PETS.   Day of Surgery: Take a shower with CHG soap. Wear Clean/Comfortable clothing the morning of surgery Do not apply any deodorants/lotions.   Remember to brush your teeth WITH YOUR REGULAR TOOTHPASTE.    COVID testing  If you are going to stay overnight or be admitted after your procedure/surgery and require a pre-op COVID test, please follow these instructions after your COVID test   You are not required to quarantine however you are required to wear a well-fitting mask when you are out and  around people not in your household.  If your mask becomes wet or soiled, replace with a new one.  Wash your hands often with soap and water for 20 seconds or clean your hands with an alcohol-based hand sanitizer that contains at least 60% alcohol.  Do not share personal items.  Notify your provider: if you are in close contact with someone who has COVID  or if you develop a fever of 100.4 or greater, sneezing, cough, sore throat, shortness of breath or body aches.    Please read over the following fact sheets that you were given.

## 2021-12-08 ENCOUNTER — Encounter (HOSPITAL_COMMUNITY): Payer: Self-pay

## 2021-12-08 ENCOUNTER — Encounter (HOSPITAL_COMMUNITY)
Admission: RE | Admit: 2021-12-08 | Discharge: 2021-12-08 | Disposition: A | Discharge status: Home / Self Care | Payer: No Typology Code available for payment source | Source: Ambulatory Visit | Attending: General Surgery | Admitting: General Surgery

## 2021-12-08 ENCOUNTER — Other Ambulatory Visit: Payer: Self-pay

## 2021-12-08 VITALS — BP 122/86 | HR 90 | Temp 98.3°F | Resp 18 | Ht 74.0 in | Wt 198.7 lb

## 2021-12-08 DIAGNOSIS — Z01812 Encounter for preprocedural laboratory examination: Secondary | ICD-10-CM | POA: Diagnosis not present

## 2021-12-08 DIAGNOSIS — Z01818 Encounter for other preprocedural examination: Secondary | ICD-10-CM

## 2021-12-08 HISTORY — DX: Gastro-esophageal reflux disease without esophagitis: K21.9

## 2021-12-08 LAB — GLUCOSE, CAPILLARY: Glucose-Capillary: 101 mg/dL — ABNORMAL HIGH (ref 70–99)

## 2021-12-08 LAB — CBC
HCT: 42.6 % (ref 36.0–46.0)
Hemoglobin: 14.2 g/dL (ref 12.0–15.0)
MCH: 32.5 pg (ref 26.0–34.0)
MCHC: 33.3 g/dL (ref 30.0–36.0)
MCV: 97.5 fL (ref 80.0–100.0)
Platelets: 263 10*3/uL (ref 150–400)
RBC: 4.37 MIL/uL (ref 3.87–5.11)
RDW: 12 % (ref 11.5–15.5)
WBC: 9.3 10*3/uL (ref 4.0–10.5)
nRBC: 0 % (ref 0.0–0.2)

## 2021-12-08 NOTE — Progress Notes (Signed)
PCP - Nani Gasser Cardiologist - denies  PPM/ICD - denies   Chest x-ray - n/a EKG - n/a Stress Test -denies  ECHO - denies Cardiac Cath - denies  Sleep Study - denies   No diabetic  Patient instructed to hold all Aspirin, NSAID's, herbal medications, fish oil and vitamins 7 days prior to surgery.   ERAS Protcol -yes PRE-SURGERY Ensure or G2- ensure ordered and given  COVID TEST- ambulatory surgery, not needed   Anesthesia review: no  Patient denies shortness of breath, fever, cough and chest pain at PAT appointment   All instructions explained to the patient, with a verbal understanding of the material. Patient agrees to go over the instructions while at home for a better understanding. Patient also instructed to self quarantine after being tested for COVID-19. The opportunity to ask questions was provided.

## 2021-12-13 ENCOUNTER — Encounter (HOSPITAL_COMMUNITY): Payer: Self-pay | Admitting: General Surgery

## 2021-12-13 NOTE — Anesthesia Preprocedure Evaluation (Addendum)
Anesthesia Evaluation  Patient identified by MRN, date of birth, ID band Patient awake    Reviewed: Allergy & Precautions, NPO status , Patient's Chart, lab work & pertinent test results  History of Anesthesia Complications Negative for: history of anesthetic complications  Airway Mallampati: II  TM Distance: >3 FB Neck ROM: Full    Dental  (+) Dental Advisory Given   Pulmonary Current Smoker and Patient abstained from smoking.,    Pulmonary exam normal        Cardiovascular negative cardio ROS Normal cardiovascular exam     Neuro/Psych PSYCHIATRIC DISORDERS Anxiety Depression negative neurological ROS     GI/Hepatic Neg liver ROS, GERD  Medicated and Controlled,  Endo/Other  negative endocrine ROS  Renal/GU negative Renal ROS     Musculoskeletal negative musculoskeletal ROS (+)   Abdominal   Peds  Hematology negative hematology ROS (+)   Anesthesia Other Findings   Reproductive/Obstetrics  PCOS                             Anesthesia Physical Anesthesia Plan  ASA: 2  Anesthesia Plan: General   Post-op Pain Management: Tylenol PO (pre-op)* and Celebrex PO (pre-op)*   Induction: Intravenous  PONV Risk Score and Plan: 4 or greater and Treatment may vary due to age or medical condition, Ondansetron, Midazolam, Dexamethasone and Scopolamine patch - Pre-op  Airway Management Planned: Oral ETT  Additional Equipment: None  Intra-op Plan:   Post-operative Plan: Extubation in OR  Informed Consent: I have reviewed the patients History and Physical, chart, labs and discussed the procedure including the risks, benefits and alternatives for the proposed anesthesia with the patient or authorized representative who has indicated his/her understanding and acceptance.     Dental advisory given  Plan Discussed with: CRNA and Anesthesiologist  Anesthesia Plan Comments:         Anesthesia Quick Evaluation

## 2021-12-14 ENCOUNTER — Ambulatory Visit (HOSPITAL_COMMUNITY): Payer: No Typology Code available for payment source | Admitting: Certified Registered Nurse Anesthetist

## 2021-12-14 ENCOUNTER — Ambulatory Visit (HOSPITAL_COMMUNITY): Payer: No Typology Code available for payment source

## 2021-12-14 ENCOUNTER — Ambulatory Visit (HOSPITAL_COMMUNITY)
Admission: RE | Admit: 2021-12-14 | Discharge: 2021-12-14 | Disposition: A | Discharge status: Home / Self Care | Payer: No Typology Code available for payment source | Source: Ambulatory Visit | Attending: General Surgery | Admitting: General Surgery

## 2021-12-14 ENCOUNTER — Encounter (HOSPITAL_COMMUNITY): Payer: Self-pay | Admitting: General Surgery

## 2021-12-14 ENCOUNTER — Ambulatory Visit: Payer: 59 | Admitting: Family Medicine

## 2021-12-14 ENCOUNTER — Ambulatory Visit (HOSPITAL_BASED_OUTPATIENT_CLINIC_OR_DEPARTMENT_OTHER): Payer: No Typology Code available for payment source | Admitting: Certified Registered Nurse Anesthetist

## 2021-12-14 ENCOUNTER — Encounter (HOSPITAL_COMMUNITY)
Admission: RE | Disposition: A | Discharge status: Home / Self Care | Payer: Self-pay | Source: Ambulatory Visit | Attending: General Surgery

## 2021-12-14 ENCOUNTER — Other Ambulatory Visit: Payer: Self-pay

## 2021-12-14 DIAGNOSIS — K8064 Calculus of gallbladder and bile duct with chronic cholecystitis without obstruction: Secondary | ICD-10-CM | POA: Diagnosis present

## 2021-12-14 DIAGNOSIS — F32A Depression, unspecified: Secondary | ICD-10-CM | POA: Insufficient documentation

## 2021-12-14 DIAGNOSIS — Z419 Encounter for procedure for purposes other than remedying health state, unspecified: Secondary | ICD-10-CM

## 2021-12-14 DIAGNOSIS — K8010 Calculus of gallbladder with chronic cholecystitis without obstruction: Secondary | ICD-10-CM

## 2021-12-14 DIAGNOSIS — F419 Anxiety disorder, unspecified: Secondary | ICD-10-CM | POA: Diagnosis not present

## 2021-12-14 HISTORY — PX: CHOLECYSTECTOMY: SHX55

## 2021-12-14 HISTORY — PX: INTRAOPERATIVE CHOLANGIOGRAM: SHX5230

## 2021-12-14 LAB — POCT PREGNANCY, URINE: Preg Test, Ur: NEGATIVE

## 2021-12-14 SURGERY — LAPAROSCOPIC CHOLECYSTECTOMY
Anesthesia: General | Site: Abdomen

## 2021-12-14 MED ORDER — LACTATED RINGERS IV SOLN: INTRAVENOUS | Status: DC

## 2021-12-14 MED ORDER — SODIUM CHLORIDE 0.9 % IR SOLN
Status: DC | PRN
  Administered 2021-12-14: 1000 mL

## 2021-12-14 MED ORDER — CHLORHEXIDINE GLUCONATE 0.12 % MT SOLN
15.0000 mL | Freq: Once | OROMUCOSAL | Status: AC
  Administered 2021-12-14: 15 mL via OROMUCOSAL
  Filled 2021-12-14: qty 15

## 2021-12-14 MED ORDER — ACETAMINOPHEN 500 MG PO TABS: 1000.0000 mg | ORAL_TABLET | Freq: Three times a day (TID) | ORAL | 0 refills | Status: AC

## 2021-12-14 MED ORDER — MIDAZOLAM HCL 2 MG/2ML IJ SOLN
INTRAMUSCULAR | Status: DC | PRN
  Administered 2021-12-14: 2 mg via INTRAVENOUS

## 2021-12-14 MED ORDER — CHLORHEXIDINE GLUCONATE CLOTH 2 % EX PADS: 6.0000 | MEDICATED_PAD | Freq: Once | CUTANEOUS | Status: DC

## 2021-12-14 MED ORDER — OXYCODONE HCL 5 MG/5ML PO SOLN: 5.0000 mg | Freq: Once | ORAL | Status: DC | PRN

## 2021-12-14 MED ORDER — LIDOCAINE 2% (20 MG/ML) 5 ML SYRINGE
INTRAMUSCULAR | Status: AC
  Filled 2021-12-14: qty 5

## 2021-12-14 MED ORDER — FENTANYL CITRATE (PF) 250 MCG/5ML IJ SOLN
INTRAMUSCULAR | Status: DC | PRN
  Administered 2021-12-14 (×4): 50 ug via INTRAVENOUS
  Administered 2021-12-14: 100 ug via INTRAVENOUS

## 2021-12-14 MED ORDER — DEXAMETHASONE SODIUM PHOSPHATE 10 MG/ML IJ SOLN
INTRAMUSCULAR | Status: DC | PRN
  Administered 2021-12-14: 10 mg via INTRAVENOUS

## 2021-12-14 MED ORDER — OXYCODONE HCL 5 MG PO TABS: 5.0000 mg | ORAL_TABLET | Freq: Four times a day (QID) | ORAL | 0 refills | Status: DC | PRN

## 2021-12-14 MED ORDER — PROPOFOL 10 MG/ML IV BOLUS
INTRAVENOUS | Status: AC
  Filled 2021-12-14: qty 20

## 2021-12-14 MED ORDER — DEXMEDETOMIDINE (PRECEDEX) IN NS 20 MCG/5ML (4 MCG/ML) IV SYRINGE
PREFILLED_SYRINGE | INTRAVENOUS | Status: DC | PRN
  Administered 2021-12-14 (×4): 8 ug via INTRAVENOUS
  Administered 2021-12-14 (×2): 4 ug via INTRAVENOUS

## 2021-12-14 MED ORDER — SODIUM CHLORIDE 0.9 % IV SOLN
INTRAVENOUS | Status: DC | PRN
  Administered 2021-12-14: 10 mL

## 2021-12-14 MED ORDER — BUPIVACAINE-EPINEPHRINE 0.25% -1:200000 IJ SOLN
INTRAMUSCULAR | Status: DC | PRN
  Administered 2021-12-14: 15 mL

## 2021-12-14 MED ORDER — ROCURONIUM BROMIDE 10 MG/ML (PF) SYRINGE
PREFILLED_SYRINGE | INTRAVENOUS | Status: AC
  Filled 2021-12-14: qty 10

## 2021-12-14 MED ORDER — FENTANYL CITRATE (PF) 250 MCG/5ML IJ SOLN
INTRAMUSCULAR | Status: AC
  Filled 2021-12-14: qty 5

## 2021-12-14 MED ORDER — SUGAMMADEX SODIUM 200 MG/2ML IV SOLN
INTRAVENOUS | Status: DC | PRN
  Administered 2021-12-14: 200 mg via INTRAVENOUS

## 2021-12-14 MED ORDER — PROMETHAZINE HCL 25 MG/ML IJ SOLN: 6.2500 mg | INTRAMUSCULAR | Status: DC | PRN

## 2021-12-14 MED ORDER — OXYCODONE HCL 5 MG PO TABS: 5.0000 mg | ORAL_TABLET | Freq: Once | ORAL | Status: DC | PRN

## 2021-12-14 MED ORDER — ONDANSETRON HCL 4 MG/2ML IJ SOLN
INTRAMUSCULAR | Status: AC
  Filled 2021-12-14: qty 2

## 2021-12-14 MED ORDER — ONDANSETRON HCL 4 MG/2ML IJ SOLN
INTRAMUSCULAR | Status: DC | PRN
  Administered 2021-12-14: 4 mg via INTRAVENOUS

## 2021-12-14 MED ORDER — 0.9 % SODIUM CHLORIDE (POUR BTL) OPTIME
TOPICAL | Status: DC | PRN
Start: 2021-12-14 — End: 2021-12-14
  Administered 2021-12-14: 1000 mL

## 2021-12-14 MED ORDER — PROPOFOL 10 MG/ML IV BOLUS
INTRAVENOUS | Status: DC | PRN
  Administered 2021-12-14: 20 mg via INTRAVENOUS
  Administered 2021-12-14: 200 mg via INTRAVENOUS

## 2021-12-14 MED ORDER — BUPIVACAINE-EPINEPHRINE (PF) 0.25% -1:200000 IJ SOLN
INTRAMUSCULAR | Status: AC
  Filled 2021-12-14: qty 30

## 2021-12-14 MED ORDER — ACETAMINOPHEN 500 MG PO TABS
1000.0000 mg | ORAL_TABLET | Freq: Once | ORAL | Status: DC
  Filled 2021-12-14: qty 2

## 2021-12-14 MED ORDER — ROCURONIUM BROMIDE 10 MG/ML (PF) SYRINGE
PREFILLED_SYRINGE | INTRAVENOUS | Status: DC | PRN
  Administered 2021-12-14: 60 mg via INTRAVENOUS
  Administered 2021-12-14: 40 mg via INTRAVENOUS
  Administered 2021-12-14: 20 mg via INTRAVENOUS

## 2021-12-14 MED ORDER — MIDAZOLAM HCL 2 MG/2ML IJ SOLN
INTRAMUSCULAR | Status: AC
  Filled 2021-12-14: qty 2

## 2021-12-14 MED ORDER — DEXAMETHASONE SODIUM PHOSPHATE 10 MG/ML IJ SOLN
INTRAMUSCULAR | Status: AC
  Filled 2021-12-14: qty 1

## 2021-12-14 MED ORDER — LIDOCAINE 2% (20 MG/ML) 5 ML SYRINGE
INTRAMUSCULAR | Status: DC | PRN
  Administered 2021-12-14: 60 mg via INTRAVENOUS

## 2021-12-14 MED ORDER — ORAL CARE MOUTH RINSE: 15.0000 mL | Freq: Once | OROMUCOSAL | Status: AC

## 2021-12-14 MED ORDER — CELECOXIB 200 MG PO CAPS
200.0000 mg | ORAL_CAPSULE | Freq: Once | ORAL | Status: AC
  Administered 2021-12-14: 200 mg via ORAL
  Filled 2021-12-14: qty 1

## 2021-12-14 MED ORDER — INDOCYANINE GREEN 25 MG IV SOLR
INTRAVENOUS | Status: DC | PRN
Start: 2021-12-14 — End: 2021-12-14
  Administered 2021-12-14: 7.5 mg via INTRAVENOUS

## 2021-12-14 MED ORDER — FENTANYL CITRATE (PF) 100 MCG/2ML IJ SOLN
25.0000 ug | INTRAMUSCULAR | Status: DC | PRN
  Administered 2021-12-14 (×3): 25 ug via INTRAVENOUS

## 2021-12-14 MED ORDER — KETOROLAC TROMETHAMINE 30 MG/ML IJ SOLN
INTRAMUSCULAR | Status: AC
  Filled 2021-12-14: qty 1

## 2021-12-14 MED ORDER — SODIUM CHLORIDE 0.9 % IV SOLN
2.0000 g | INTRAVENOUS | Status: AC
  Administered 2021-12-14: 2 g via INTRAVENOUS
  Filled 2021-12-14: qty 2

## 2021-12-14 MED ORDER — FENTANYL CITRATE (PF) 100 MCG/2ML IJ SOLN
INTRAMUSCULAR | Status: AC
  Filled 2021-12-14: qty 2

## 2021-12-14 MED ORDER — KETOROLAC TROMETHAMINE 30 MG/ML IJ SOLN
INTRAMUSCULAR | Status: DC | PRN
  Administered 2021-12-14: 30 mg via INTRAVENOUS

## 2021-12-14 SURGICAL SUPPLY — 46 items
ADH SKN CLS LQ APL DERMABOND (GAUZE/BANDAGES/DRESSINGS) ×2
APL PRP STRL LF DISP 70% ISPRP (MISCELLANEOUS) ×1
APPLIER CLIP 5 13 M/L LIGAMAX5 (MISCELLANEOUS) ×2
APR CLP MED LRG 5 ANG JAW (MISCELLANEOUS) ×1
BAG SPEC RTRVL 10 TROC 200 (ENDOMECHANICALS) ×2
BLADE CLIPPER SURG (BLADE) IMPLANT
BLADE SURG 11 STRL SS (BLADE) ×1 IMPLANT
CANISTER SUCT 3000ML PPV (MISCELLANEOUS) ×2 IMPLANT
CHLORAPREP W/TINT 26 (MISCELLANEOUS) ×2 IMPLANT
CLIP APPLIE 5 13 M/L LIGAMAX5 (MISCELLANEOUS) ×1 IMPLANT
COVER MAYO STAND STRL (DRAPES) ×1 IMPLANT
COVER SURGICAL LIGHT HANDLE (MISCELLANEOUS) ×2 IMPLANT
DERMABOND ADHESIVE PROPEN (GAUZE/BANDAGES/DRESSINGS) ×2
DERMABOND ADVANCED .7 DNX6 (GAUZE/BANDAGES/DRESSINGS) IMPLANT
DRAPE C-ARM 42X120 X-RAY (DRAPES) ×1 IMPLANT
ELECT REM PT RETURN 9FT ADLT (ELECTROSURGICAL) ×2
ELECTRODE REM PT RTRN 9FT ADLT (ELECTROSURGICAL) ×1 IMPLANT
GAUZE SPONGE 2X2 8PLY STRL LF (GAUZE/BANDAGES/DRESSINGS) ×1 IMPLANT
GLOVE SURG ENC TEXT LTX SZ7.5 (GLOVE) ×2 IMPLANT
GLOVE SURG UNDER LTX SZ8 (GLOVE) ×4 IMPLANT
GOWN STRL REUS W/ TWL LRG LVL3 (GOWN DISPOSABLE) ×2 IMPLANT
GOWN STRL REUS W/TWL 2XL LVL3 (GOWN DISPOSABLE) ×2 IMPLANT
GOWN STRL REUS W/TWL LRG LVL3 (GOWN DISPOSABLE) ×4
GRASPER SUT TROCAR 14GX15 (MISCELLANEOUS) ×1 IMPLANT
KIT BASIN OR (CUSTOM PROCEDURE TRAY) ×2 IMPLANT
KIT TURNOVER KIT B (KITS) ×2 IMPLANT
NS IRRIG 1000ML POUR BTL (IV SOLUTION) ×2 IMPLANT
PAD ARMBOARD 7.5X6 YLW CONV (MISCELLANEOUS) ×2 IMPLANT
POUCH RETRIEVAL ECOSAC 10 (ENDOMECHANICALS) ×1 IMPLANT
POUCH RETRIEVAL ECOSAC 10MM (ENDOMECHANICALS) ×4
SCISSORS LAP 5X35 DISP (ENDOMECHANICALS) ×2 IMPLANT
SET CHOLANGIOGRAPH 5 50 .035 (SET/KITS/TRAYS/PACK) ×1 IMPLANT
SET IRRIG TUBING LAPAROSCOPIC (IRRIGATION / IRRIGATOR) ×2 IMPLANT
SET TUBE SMOKE EVAC HIGH FLOW (TUBING) ×2 IMPLANT
SLEEVE ENDOPATH XCEL 5M (ENDOMECHANICALS) ×4 IMPLANT
SPECIMEN JAR SMALL (MISCELLANEOUS) ×2 IMPLANT
SPONGE GAUZE 2X2 STER 10/PKG (GAUZE/BANDAGES/DRESSINGS) ×1
SUT MNCRL AB 4-0 PS2 18 (SUTURE) ×2 IMPLANT
SUT VICRYL 0 TIES 12 18 (SUTURE) ×1 IMPLANT
SUT VICRYL 0 UR6 27IN ABS (SUTURE) ×2 IMPLANT
TOWEL GREEN STERILE (TOWEL DISPOSABLE) ×2 IMPLANT
TOWEL GREEN STERILE FF (TOWEL DISPOSABLE) ×2 IMPLANT
TRAY LAPAROSCOPIC MC (CUSTOM PROCEDURE TRAY) ×2 IMPLANT
TROCAR XCEL BLUNT TIP 100MML (ENDOMECHANICALS) ×2 IMPLANT
TROCAR XCEL NON-BLD 5MMX100MML (ENDOMECHANICALS) ×2 IMPLANT
WATER STERILE IRR 1000ML POUR (IV SOLUTION) ×2 IMPLANT

## 2021-12-14 NOTE — Anesthesia Postprocedure Evaluation (Signed)
Anesthesia Post Note  Patient: Charlina Dwight  Procedure(s) Performed: LAPAROSCOPIC CHOLECYSTECTOMY with ICG DYE (Abdomen) INTRAOPERATIVE CHOLANGIOGRAM (Abdomen)     Patient location during evaluation: PACU Anesthesia Type: General Level of consciousness: awake and alert Pain management: pain level controlled Vital Signs Assessment: post-procedure vital signs reviewed and stable Respiratory status: spontaneous breathing, nonlabored ventilation and respiratory function stable Cardiovascular status: stable and blood pressure returned to baseline Anesthetic complications: no   No notable events documented.  Last Vitals:  Vitals:   12/14/21 0948 12/14/21 1003  BP: 119/71 121/76  Pulse: 96 93  Resp: 15 17  Temp:    SpO2: 100% 94%    Last Pain:  Vitals:   12/14/21 0614  TempSrc: Oral  PainSc: 0-No pain                 Beryle Lathe

## 2021-12-14 NOTE — Op Note (Signed)
Taylor [name] [id number] 1981-03-21 12/14/2021  Laparoscopic Cholecystectomy with IOC & ICG dye immunofluorescence procedure Note  Indications: This patient presents with symptomatic gallbladder disease and will undergo laparoscopic cholecystectomy.  Pre-operative Diagnosis: symptomatic cholelithiasis   Post-operative Diagnosis: same; probable chronic calculous cholecystitis  Surgeon: Gaynelle Adu MD FACS  Surgery Resident: Isabella Stalling PGY4  Anesthesia: General endotracheal anesthesia  Procedure Details  The patient was seen again in the Holding Room. The risks, benefits, complications, treatment options, and expected outcomes were discussed with the patient. The possibilities of reaction to medication, pulmonary aspiration, perforation of viscus, bleeding, recurrent infection, finding a normal gallbladder, the need for additional procedures, failure to diagnose a condition, the possible need to convert to an open procedure, and creating a complication requiring transfusion or operation were discussed with the patient. The likelihood of improving the patient's symptoms with return to their baseline status is good.  The patient and/or family concurred with the proposed plan, giving informed consent. The site of surgery properly noted. The patient was taken to Operating Room, identified as Taylor [name] and the procedure verified as Laparoscopic Cholecystectomy with Intraoperative Cholangiogram. A Time Out was held and the above information confirmed. Antibiotic prophylaxis was administered.   Prior to the induction of general anesthesia, antibiotic prophylaxis was administered. General endotracheal anesthesia was then administered and tolerated well. After the induction, the abdomen was prepped with Chloraprep and draped in the sterile fashion. The patient was positioned in the supine position.  Local anesthetic agent was injected into the skin near the umbilicus and an incision made. We  dissected down to the abdominal fascia with blunt dissection.  The fascia was incised vertically and we entered the peritoneal cavity bluntly.  A pursestring suture of 0-Vicryl was placed around the fascial opening.  The Hasson cannula was inserted and secured with the stay suture.  Pneumoperitoneum was then created with CO2 and tolerated well without any adverse changes in the patient's vital signs. An 5-mm port was placed in the subxiphoid position.  Two 5-mm ports were placed in the right upper quadrant. All skin incisions were infiltrated with a local anesthetic agent before making the incision and placing the trocars.   We positioned the patient in reverse Trendelenburg, tilted slightly to the patient's left.  The gallbladder was identified, the fundus grasped and retracted cephalad. Adhesions were lysed bluntly and with the electrocautery where indicated, taking care not to injure any adjacent organs or viscus. The infundibulum was grasped and retracted laterally, exposing the peritoneum overlying the triangle of Calot. This was then divided and exposed in a blunt fashion. A critical view of the cystic duct and cystic artery was obtained.  ICG near infrared fluorescence was activated.  We could see opacification and immunofluorescence of the cystic duct along with the common bile duct.  We could see the confluence of the cystic duct and common bile duct.  The cystic duct was clearly identified and bluntly dissected circumferentially. The cystic duct was ligated with a clip distally.   An incision was made in the cystic duct and the [facility] cholangiogram catheter introduced. The catheter was secured using a clip. A cholangiogram was then obtained which showed good visualization of the distal and proximal biliary tree with no sign of filling defects or obstruction.  Contrast flowed easily into the duodenum. The catheter was then removed.   The cystic duct was then ligated with clips and divided. The cystic  artery (both the anterior and posterior branch) which had been identified &  dissected free was ligated with clips and divided as well.   The gallbladder was dissected from the liver bed in retrograde fashion with the electrocautery. The gallbladder was removed and placed in an Ecco sac.  The gallbladder and Ecco sac were then attempted to be removed through the umbilical port site.  The retrieval bag split.  We put the bag and gallbladder back into the abdominal cavity.  We obtained another retrieval bag and placed the gallbladder in the knee retrieval bag.  We then ended up enlarging the fascia with a pair of scissors.  We were then able to extract the gallbladder within the bag as well as the other specimen bag in its entirety.  The liver bed was irrigated and inspected. Hemostasis was achieved with the electrocautery. Copious irrigation was utilized and was repeatedly aspirated until clear.  The pursestring suture was used to close the umbilical fascia.  Since we had enlarge the fascial defect we placed 3 additional interrupted 0 Vicryl's using a PMI suture passer with laparoscopic guidance.  Local was infiltrated.  We again inspected the right upper quadrant for hemostasis.  The umbilical closure was inspected and there was no air leak and nothing trapped within the closure. Pneumoperitoneum was released as we removed the trocars.  4-0 Monocryl was used to close the skin.   Dermabond was applied. The patient was then extubated and brought to the recovery room in stable condition. Instrument, sponge, and needle counts were correct at closure and at the conclusion of the case.   Findings: Chronic Cholecystitis with Cholelithiasis Large gallstone Nml ioc  Estimated Blood Loss: Minimal         Drains: none         Specimens: Gallbladder           Complications: None; patient tolerated the procedure well.         Disposition: PACU - hemodynamically stable.         Condition: stable  I was  personally present and scrubbed during the entire procedure except for final skin closure the key and immediately available t during skin closure, as documented in my operative note.  Mary Sella. Andrey Campanile, MD, FACS General, Bariatric, & Minimally Invasive Surgery [facility], [place]

## 2021-12-14 NOTE — Transfer of Care (Signed)
Immediate Anesthesia Transfer of Care Note  Patient: Taylor [name]  Procedure(s) Performed: LAPAROSCOPIC CHOLECYSTECTOMY with ICG DYE (Abdomen) INTRAOPERATIVE CHOLANGIOGRAM (Abdomen)  Patient Location: PACU  Anesthesia Type:General  Level of Consciousness: drowsy and patient cooperative  Airway & Oxygen Therapy: Patient Spontanous Breathing and Patient connected to face mask oxygen  Post-op Assessment: Report given to RN and Post -op Vital signs reviewed and stable  Post vital signs: Reviewed and stable  Last Vitals:  Vitals Value Taken Time  BP 108/74 12/14/21 0934  Temp 36.3 C 12/14/21 0934  Pulse 85 12/14/21 0936  Resp 32 12/14/21 0936  SpO2 94 % 12/14/21 0936  Vitals shown include unvalidated device data.  Last Pain:  Vitals:   12/14/21 0614  TempSrc: Oral  PainSc: 0-No pain      Patients Stated Pain Goal: 3 ([id number])  Complications: No notable events documented.

## 2021-12-14 NOTE — H&P (Signed)
REFERRING PHYSICIAN: Lovena Le*  PROVIDER: Meaghen Vecchiarelli Leanne Chang, MD  MRN: [id number] DOB: [birth year] DATE OF ENCOUNTER: 11/26/2021  Subjective   Chief Complaint: New Patient (New patient gallbladder )   History of Present Illness: Taylor [name] is a 41 y.o. female who is seen today as an office consultation at the request of Dr. Madilyn Fireman for evaluation of New Patient (New patient gallbladder )  She is referred by her PCP for evaluation of intermittent crampy stomach pain for several weeks. She had presented to her PCP in mid December complaining of intermittent stomach pain for 2 weeks triggered after she eats. It last did up to 3 more hours. She told her PCP it started in her epigastric area and radiated to her back. Associated with bloating and gassy. No diarrhea. No melena or hematochezia. Has a history of IBS and feels similar to that but not to that severity.  Reports ongoing minor episodes. She believes the episodes have not been as severe as her initial episode because she has modified her diet. No fevers or chills.    Review of Systems: A complete review of systems was obtained from the patient. I have reviewed this information and discussed as appropriate with the patient. See HPI as well for other ROS.  ROS   Medical History: Past Medical History:  Diagnosis Date   Anxiety   There is no problem list on file for this patient.  History reviewed. No pertinent surgical history.   Allergies  Allergen Reactions   Erythromycin Base Rash  REACTION: Rash REACTION: Rash   Current Outpatient Medications on File Prior to Visit  Medication Sig Dispense Refill   clonazePAM (KLONOPIN) 1 MG tablet clonazepam 1 mg tablet TAKE 1 TABLET AT BEDTIME AS NEEDED FOR ANXIETY   lisdexamfetamine (VYVANSE) 20 MG capsule Vyvanse 20 mg capsule TAKE 1 CAPSULE BY MOUTH EVERY DAY   norethindrone-ethinyl estradiol (JUNEL FE 1/20, 28,) 1 mg-20 mcg (21)/75 mg (7) tablet Take 1 tablet by  mouth once daily   buPROPion (WELLBUTRIN XL) 150 MG XL tablet bupropion HCl XL 150 mg 24 hr tablet, extended release TAKE 1 TABLET BY MOUTH EVERY DAY IN THE MORNING   citalopram (CELEXA) 20 MG tablet Take 20 mg by mouth once daily   valACYclovir (VALTREX) 1000 MG tablet valacyclovir 1 gram tablet TAKE 1 TABLET EVERY 12 HOURS BY ORAL ROUTE AS NEEDED FOR 3 DAYS. *INS LIMIT 30/30 DAYS*   No current facility-administered medications on file prior to visit.   Family History  Problem Relation Age of Onset   Skin cancer Father    Social History   Tobacco Use  Smoking Status Former   Types: Cigarettes   Quit date: 2023   Years since quitting: 0.0  Smokeless Tobacco Not on file    Social History   Socioeconomic History   Marital status: Married  Tobacco Use   Smoking status: Former  Types: Cigarettes  Quit date: 2023  Years since quitting: 0.0  Substance and Sexual Activity   Alcohol use: Yes  Comment: 1-2 a week   Drug use: Never   Objective:   Vitals:  11/26/21 0859  BP: 124/68  Pulse: 96  Temp: 36.8 C (98.3 F)  SpO2: 98%  Weight: 90.6 kg (199 lb 12.8 oz)  Height: 162.6 cm (5\' 4" )   Body mass index is 34.3 kg/m.  Constitutional: NAD; conversant; no deformities Eyes: Moist conjunctiva; no lid lag; anicteric; PERRL Neck: Trachea midline; no thyromegaly Lungs: Normal respiratory effort; no  tactile fremitus CV: RRR; no palpable thrills; no pitting edema GI: Abd soft, nontender, nondistended; no palpable hepatosplenomegaly MSK: Normal gait; no clubbing/cyanosis Psychiatric: Appropriate affect; alert and oriented x3 Lymphatic: No palpable cervical or axillary lymphadenopathy Skin: No rash, lesions or edema  Labs, Imaging and Diagnostic Testing: He underwent an ultrasound on December 14 which showed a contracted gallbladder with gallstones, diffuse gallbladder wall thickening but no Murphy sign. The bile duct was normal at 5 mm. She had lipase, CBC and  comprehensive metabolic panel which were within normal limits  Assessment and Plan:    Diagnoses and all orders for this visit:  Symptomatic cholelithiasis    I believe the patient's symptoms are consistent with gallbladder disease.  We discussed gallbladder disease. The patient was given Neurosurgeon. We discussed non-operative and operative management. We discussed the signs & symptoms of acute cholecystitis  I discussed laparoscopic cholecystectomy with possible IOC in detail. The patient was given educational material as well as diagrams detailing the procedure. We discussed the risks and benefits of a laparoscopic cholecystectomy including, but not limited to bleeding, infection, injury to surrounding structures such as the intestine or liver, bile leak, retained gallstones, need to convert to an open procedure, prolonged diarrhea, blood clots such as DVT, common bile duct injury, anesthesia risks, and possible need for additional procedures. We discussed the typical post-operative recovery course. I explained that the likelihood of improvement of their symptoms is good.  This patient encounter took 35 minutes today to perform the following: take history, perform exam, review outside records, interpret imaging, counsel the patient on their diagnosis and document encounter, findings & plan in the EHR  No follow-ups on file.  Janise Gora Leanne Chang, MD  General, Minimally Invasive, & Bariatric Surgery

## 2021-12-14 NOTE — Interval H&P Note (Signed)
History and Physical Interval Note:  12/14/2021 7:14 AM  Taylor [name]  has presented today for surgery, with the diagnosis of SYMPTOMATIC CHOLELITHIASIS.  The various methods of treatment have been discussed with the patient and family. After consideration of risks, benefits and other options for treatment, the patient has consented to  Procedure(s): LAPAROSCOPIC CHOLECYSTECTOMY POSSIBLE IOC WITH ICG DYE (N/A) as a surgical intervention.  The patient's history has been reviewed, patient examined, no change in status, stable for surgery.  I have reviewed the patient's chart and labs.  Questions were answered to the patient's satisfaction.     Discussed surgical resident involvement with case  Gaynelle Adu

## 2021-12-14 NOTE — Anesthesia Procedure Notes (Signed)
Procedure Name: Intubation Date/Time: 12/14/2021 7:40 AM Performed by: Carolan Clines, CRNA Pre-anesthesia Checklist: Patient identified, Emergency Drugs available, Suction available and Patient being monitored Patient Re-evaluated:Patient Re-evaluated prior to induction Oxygen Delivery Method: Circle System Utilized Preoxygenation: Pre-oxygenation with 100% oxygen Induction Type: IV induction Ventilation: Mask ventilation without difficulty Laryngoscope Size: Mac and 3 Grade View: Grade I Tube type: Oral Tube size: 7.0 mm Number of attempts: 1 Airway Equipment and Method: Stylet Placement Confirmation: ETT inserted through vocal cords under direct vision, positive ETCO2 and breath sounds checked- equal and bilateral Secured at: 22 cm Tube secured with: Tape Dental Injury: Teeth and Oropharynx as per pre-operative assessment

## 2021-12-14 NOTE — Discharge Instructions (Signed)
[facility] LAPAROSCOPIC SURGERY: POST OP INSTRUCTIONS Always review your discharge instruction sheet given to you by the facility where your surgery was performed. IF YOU HAVE DISABILITY OR FAMILY LEAVE FORMS, YOU MUST BRING THEM TO THE OFFICE FOR PROCESSING.   DO NOT GIVE THEM TO YOUR DOCTOR.  PAIN CONTROL  First take acetaminophen (Tylenol) AND/or ibuprofen (Advil) to control your pain after surgery.  Follow directions on package.  Taking acetaminophen (Tylenol) and/or ibuprofen (Advil) regularly after surgery will help to control your pain and lower the amount of prescription pain medication you may need.  You should not take more than 3,000 mg (3 grams) of acetaminophen (Tylenol) in 24 hours.  You should not take ibuprofen (Advil), aleve, motrin, naprosyn or other NSAIDS if you have a history of stomach ulcers or chronic kidney disease.  A prescription for pain medication may be given to you upon discharge.  Take your pain medication as prescribed, if you still have uncontrolled pain after taking acetaminophen (Tylenol) or ibuprofen (Advil). Use ice packs to help control pain. If you need a refill on your pain medication, please contact your pharmacy.  They will contact our office to request authorization. Prescriptions will not be filled after 5pm or on week-ends.  HOME MEDICATIONS Take your usually prescribed medications unless otherwise directed.  DIET You should follow a light diet the first few days after arrival home.  Be sure to include lots of fluids daily. Avoid fatty, fried foods.   CONSTIPATION It is common to experience some constipation after surgery and if you are taking pain medication.  Increasing fluid intake and taking a stool softener (such as Colace) will usually help or prevent this problem from occurring.  A mild laxative (Milk of Magnesia or Miralax) should be taken according to package instructions if there are no bowel movements after 48  hours.  WOUND/INCISION CARE Most patients will experience some swelling and bruising in the area of the incisions.  Ice packs will help.  Swelling and bruising can take several days to resolve.  Unless discharge instructions indicate otherwise, follow guidelines below  STERI-STRIPS - you may remove your outer bandages 48 hours after surgery, and you may shower at that time.  You have steri-strips (small skin tapes) in place directly over the incision.  These strips should be left on the skin for 7-10 days.   DERMABOND/SKIN GLUE - you may shower in 24 hours.  The glue will flake off over the next 2-3 weeks. Any sutures or staples will be removed at the office during your follow-up visit.  ACTIVITIES You may resume regular (light) daily activities beginning the next day--such as daily self-care, walking, climbing stairs--gradually increasing activities as tolerated.  You may have sexual intercourse when it is comfortable.  Refrain from any heavy lifting or straining until approved by your doctor. You may drive when you are no longer taking prescription pain medication, you can comfortably wear a seatbelt, and you can safely maneuver your car and apply brakes.  FOLLOW-UP You should see your doctor in the office for a follow-up appointment approximately 2-3 weeks after your surgery.  You should have been given your post-op/follow-up appointment when your surgery was scheduled.  If you did not receive a post-op/follow-up appointment, make sure that you call for this appointment within a day or two after you arrive home to insure a convenient appointment time.  OTHER INSTRUCTIONS   WHEN TO CALL YOUR DOCTOR: Fever over 101.0 Inability to urinate Continued   bleeding from incision. Increased pain, redness, or drainage from the incision. Increasing abdominal pain  The clinic staff is available to answer your questions during regular business hours.  Please don't hesitate to call and ask to speak to  one of the nurses for clinical concerns.  If you have a medical emergency, go to the nearest emergency room or call 911.  A surgeon from [facility] is always on call at the hospital. [address], [address], [place], [place]  [address] ? [address] [address], [place], [place]   [address] [phone] ? [phone] ? FAX [phone] Web site: [email or web address]     Managing Your Pain After Surgery Without Opioids    Thank you for participating in our program to help patients manage their pain after surgery without opioids. This is part of our effort to provide you with the best care possible, without exposing you or your family to the risk that opioids pose.  What pain can I expect after surgery? You can expect to have some pain after surgery. This is normal. The pain is typically worse the day after surgery, and quickly begins to get better. Many studies have found that many patients are able to manage their pain after surgery with Over-the-Counter (OTC) medications such as Tylenol and Motrin. If you have a condition that does not allow you to take Tylenol or Motrin, notify your surgical team.  How will I manage my pain? The best strategy for controlling your pain after surgery is around the clock pain control with Tylenol (acetaminophen) and Motrin (ibuprofen or Advil). Alternating these medications with each other allows you to maximize your pain control. In addition to Tylenol and Motrin, you can use heating pads or ice packs on your incisions to help reduce your pain.  How will I alternate your regular strength over-the-counter pain medication? You will take a dose of pain medication every three hours. Start by taking 650 mg of Tylenol (2 pills of 325 mg) 3 hours later take 600 mg of Motrin (3 pills of 200 mg) 3 hours after taking the Motrin take 650 mg of Tylenol 3 hours after that take 600 mg of Motrin.   - 1 -  See example - if your first dose of  Tylenol is at 12:00 PM   12:00 PM Tylenol 650 mg (2 pills of 325 mg)  3:00 PM Motrin 600 mg (3 pills of 200 mg)  6:00 PM Tylenol 650 mg (2 pills of 325 mg)  9:00 PM Motrin 600 mg (3 pills of 200 mg)  Continue alternating every 3 hours   We recommend that you follow this schedule around-the-clock for at least 3 days after surgery, or until you feel that it is no longer needed. Use the table on the last page of this handout to keep track of the medications you are taking. Important: Do not take more than 3000mg of Tylenol or 1600mg of Motrin in a 24-hour period. Do not take ibuprofen/Motrin if you have a history of bleeding stomach ulcers, severe kidney disease, &/or actively taking a blood thinner  What if I still have pain? If you have pain that is not controlled with the over-the-counter pain medications (Tylenol and Motrin or Advil) you might have what we call "breakthrough" pain. You will receive a prescription for a small amount of an opioid pain medication such as Oxycodone, Tramadol, or Tylenol with Codeine. Use these opioid pills in the first 24 hours after surgery if you have breakthrough   pain. Do not take more than 1 pill every 4-6 hours.  If you still have uncontrolled pain after using all opioid pills, don't hesitate to call our staff using the number provided. We will help make sure you are managing your pain in the best way possible, and if necessary, we can provide a prescription for additional pain medication.   Day 1    Time  Name of Medication Number of pills taken  Amount of Acetaminophen  Pain Level   Comments  AM PM       AM PM       AM PM       AM PM       AM PM       AM PM       AM PM       AM PM       Total Daily amount of Acetaminophen Do not take more than  3,000 mg per day      Day 2    Time  Name of Medication Number of pills taken  Amount of Acetaminophen  Pain Level   Comments  AM PM       AM PM       AM PM       AM PM       AM PM        AM PM       AM PM       AM PM       Total Daily amount of Acetaminophen Do not take more than  3,000 mg per day      Day 3    Time  Name of Medication Number of pills taken  Amount of Acetaminophen  Pain Level   Comments  AM PM       AM PM       AM PM       AM PM          AM PM       AM PM       AM PM       AM PM       Total Daily amount of Acetaminophen Do not take more than  3,000 mg per day      Day 4    Time  Name of Medication Number of pills taken  Amount of Acetaminophen  Pain Level   Comments  AM PM       AM PM       AM PM       AM PM       AM PM       AM PM       AM PM       AM PM       Total Daily amount of Acetaminophen Do not take more than  3,000 mg per day      Day 5    Time  Name of Medication Number of pills taken  Amount of Acetaminophen  Pain Level   Comments  AM PM       AM PM       AM PM       AM PM       AM PM       AM PM       AM PM       AM PM       Total Daily amount of Acetaminophen Do not take more   than  3,000 mg per day       Day 6    Time  Name of Medication Number of pills taken  Amount of Acetaminophen  Pain Level  Comments  AM PM       AM PM       AM PM       AM PM       AM PM       AM PM       AM PM       AM PM       Total Daily amount of Acetaminophen Do not take more than  3,000 mg per day      Day 7    Time  Name of Medication Number of pills taken  Amount of Acetaminophen  Pain Level   Comments  AM PM       AM PM       AM PM       AM PM       AM PM       AM PM       AM PM       AM PM       Total Daily amount of Acetaminophen Do not take more than  3,000 mg per day        For additional information about how and where to safely dispose of unused opioid medications - https://www.morepowerfulnc.org  Disclaimer: This document contains information and/or instructional materials adapted from [facility] for the typical patient with your condition. It does not  replace medical advice from your health care provider because your experience may differ from that of the typical patient. Talk to your health care provider if you have any questions about this document, your condition or your treatment plan. Adapted from [facility]  

## 2021-12-15 ENCOUNTER — Encounter (HOSPITAL_COMMUNITY): Payer: Self-pay | Admitting: General Surgery

## 2021-12-15 LAB — SURGICAL PATHOLOGY

## 2022-01-26 ENCOUNTER — Other Ambulatory Visit: Payer: Self-pay | Admitting: Family Medicine

## 2022-01-26 DIAGNOSIS — F411 Generalized anxiety disorder: Secondary | ICD-10-CM

## 2022-01-26 DIAGNOSIS — F341 Dysthymic disorder: Secondary | ICD-10-CM

## 2022-02-08 ENCOUNTER — Ambulatory Visit: Payer: No Typology Code available for payment source | Admitting: Family Medicine

## 2022-02-25 ENCOUNTER — Ambulatory Visit (INDEPENDENT_AMBULATORY_CARE_PROVIDER_SITE_OTHER): Payer: No Typology Code available for payment source | Admitting: Family Medicine

## 2022-02-25 ENCOUNTER — Encounter: Payer: Self-pay | Admitting: Family Medicine

## 2022-02-25 VITALS — BP 111/65 | HR 91 | Ht 65.0 in | Wt 201.0 lb

## 2022-02-25 DIAGNOSIS — R7301 Impaired fasting glucose: Secondary | ICD-10-CM

## 2022-02-25 DIAGNOSIS — F988 Other specified behavioral and emotional disorders with onset usually occurring in childhood and adolescence: Secondary | ICD-10-CM | POA: Diagnosis not present

## 2022-02-25 DIAGNOSIS — F411 Generalized anxiety disorder: Secondary | ICD-10-CM

## 2022-02-25 DIAGNOSIS — F341 Dysthymic disorder: Secondary | ICD-10-CM | POA: Diagnosis not present

## 2022-02-25 MED ORDER — LISDEXAMFETAMINE DIMESYLATE 20 MG PO CAPS: 20.0000 mg | ORAL_CAPSULE | Freq: Every day | ORAL | 0 refills | Status: DC

## 2022-02-25 MED ORDER — CLONAZEPAM 1 MG PO TABS: ORAL_TABLET | ORAL | 0 refills | Status: DC

## 2022-02-25 NOTE — Assessment & Plan Note (Signed)
Well controlled. Continue current regimen. Follow up in  6 mo  

## 2022-02-25 NOTE — Progress Notes (Signed)
? ?  Established Patient Office Visit ? ?Subjective   ?Patient ID: Taylor [name], female    DOB: 07-Jan-1981  Age: 41 y.o. MRN: [id number] ? ?No chief complaint on file. ? ? ?HPI ? ?Impaired fasting glucose-no increased thirst or urination. No symptoms consistent with hypoglycemia. ? ?F/U Anxiety/Depression - she is doing well on WEllbutrin and clonazapem. She is doing well on her regimen.  She does try to use her clonazepam sparingly it was last filled about 5 months ago and she does need a refill today. ? ?She did want to let me know that she started going to [facility] about 2 weeks ago and started naltrexone for weight loss.  So far she has been tolerating it well and says she feels like its been helpful. ? ?ADD - Reports symptoms are well controlled on current regime. Denies any problems with insomnia, chest pain, palpitations, or SOB.  She does not take it daily. ? ?She had a laparoscopic cholecystectomy on March 1. ? ?Impaired fasting glucose-no increased thirst or urination. No symptoms consistent with hypoglycemia. ? ? ? ?ROS ? ?  ?Objective:  ?  ? ?BP 111/65   Pulse 91   Ht 5\' 5"  (1.651 m)   Wt 201 lb (91.2 kg)   LMP  (Exact Date)   SpO2 98%   BMI 33.45 kg/m?  ? ? ?Physical Exam ?Vitals and nursing note reviewed.  ?Constitutional:   ?   Appearance: She is well-developed.  ?HENT:  ?   Head: Normocephalic and atraumatic.  ?Cardiovascular:  ?   Rate and Rhythm: Normal rate and regular rhythm.  ?   Heart sounds: Normal heart sounds.  ?Pulmonary:  ?   Effort: Pulmonary effort is normal.  ?   Breath sounds: Normal breath sounds.  ?Skin: ?   General: Skin is warm and dry.  ?Neurological:  ?   Mental Status: She is alert and oriented to person, place, and time.  ?Psychiatric:     ?   Behavior: Behavior normal.  ? ? ? ?No results found for any visits on 02/25/22. ? ? ? ?The 10-year ASCVD risk score (Arnett DK, et al., 2019) is: 1.3% ? ?  ?Assessment & Plan:  ? ?Problem List Items Addressed This Visit   ? ?  ?  Endocrine  ? IMPAIRED FASTING GLUCOSE - Primary  ?  Did not check an A1c but she reports that she just had it done at [facility] and it was 5.3.  Her last 1 was 5.0.  She is on a plan for weight loss currently. ? ?  ?  ?  ? Other  ? Attention deficit disorder  ?  Well controlled. Continue current regimen. Follow up in  6 mo  ? ?  ?  ? Relevant Medications  ? lisdexamfetamine (VYVANSE) 20 MG capsule (Start on 04/25/2022)  ? lisdexamfetamine (VYVANSE) 20 MG capsule (Start on 03/26/2022)  ? lisdexamfetamine (VYVANSE) 20 MG capsule  ? ANXIETY DEPRESSION  ?  Feels satisfied with her current regimen and does not want to make any adjustments today.  Continue Wellbutrin as well as clonazepam. ? ?  ?  ? Relevant Medications  ? clonazePAM (KLONOPIN) 1 MG tablet  ? ?Other Visit Diagnoses   ? ? GAD (generalized anxiety disorder)      ? Relevant Medications  ? clonazePAM (KLONOPIN) 1 MG tablet  ? ?  ? ? ?Return in about 6 months (around 08/27/2022) for Medication/ADD.  ? ? ?Nani Gasser, MD ? ?

## 2022-02-25 NOTE — Assessment & Plan Note (Signed)
Feels satisfied with her current regimen and does not want to make any adjustments today.  Continue Wellbutrin as well as clonazepam. ?

## 2022-02-25 NOTE — Assessment & Plan Note (Signed)
Did not check an A1c but she reports that she just had it done at [facility] and it was 5.3.  Her last 1 was 5.0.  She is on a plan for weight loss currently. ?

## 2022-05-26 ENCOUNTER — Other Ambulatory Visit: Payer: Self-pay | Admitting: Obstetrics & Gynecology

## 2022-05-26 DIAGNOSIS — R928 Other abnormal and inconclusive findings on diagnostic imaging of breast: Secondary | ICD-10-CM

## 2022-06-02 ENCOUNTER — Other Ambulatory Visit: Payer: Self-pay | Admitting: Family Medicine

## 2022-06-02 DIAGNOSIS — F411 Generalized anxiety disorder: Secondary | ICD-10-CM

## 2022-06-02 DIAGNOSIS — F341 Dysthymic disorder: Secondary | ICD-10-CM

## 2022-06-03 ENCOUNTER — Ambulatory Visit: Payer: No Typology Code available for payment source

## 2022-06-03 ENCOUNTER — Ambulatory Visit
Admission: RE | Admit: 2022-06-03 | Discharge: 2022-06-03 | Disposition: A | Discharge status: Home / Self Care | Payer: No Typology Code available for payment source | Source: Ambulatory Visit | Attending: Obstetrics & Gynecology | Admitting: Obstetrics & Gynecology

## 2022-06-03 ENCOUNTER — Other Ambulatory Visit: Payer: Self-pay | Admitting: General Surgery

## 2022-06-03 DIAGNOSIS — R928 Other abnormal and inconclusive findings on diagnostic imaging of breast: Secondary | ICD-10-CM

## 2022-06-03 DIAGNOSIS — K8050 Calculus of bile duct without cholangitis or cholecystitis without obstruction: Secondary | ICD-10-CM

## 2022-06-08 ENCOUNTER — Ambulatory Visit
Admission: RE | Admit: 2022-06-08 | Discharge: 2022-06-08 | Disposition: A | Discharge status: Home / Self Care | Payer: No Typology Code available for payment source | Source: Ambulatory Visit | Attending: General Surgery | Admitting: General Surgery

## 2022-06-08 DIAGNOSIS — K8050 Calculus of bile duct without cholangitis or cholecystitis without obstruction: Secondary | ICD-10-CM

## 2022-07-12 ENCOUNTER — Encounter: Payer: No Typology Code available for payment source | Admitting: Sports Medicine

## 2022-07-29 ENCOUNTER — Other Ambulatory Visit: Payer: Self-pay | Admitting: Family Medicine

## 2022-07-29 DIAGNOSIS — F411 Generalized anxiety disorder: Secondary | ICD-10-CM

## 2022-07-29 DIAGNOSIS — F341 Dysthymic disorder: Secondary | ICD-10-CM

## 2022-08-29 ENCOUNTER — Ambulatory Visit: Payer: No Typology Code available for payment source | Admitting: Family Medicine

## 2022-09-05 ENCOUNTER — Ambulatory Visit (INDEPENDENT_AMBULATORY_CARE_PROVIDER_SITE_OTHER): Payer: No Typology Code available for payment source | Admitting: Family Medicine

## 2022-09-05 ENCOUNTER — Encounter: Payer: Self-pay | Admitting: Family Medicine

## 2022-09-05 VITALS — BP 126/77 | HR 85 | Ht 65.0 in | Wt 201.0 lb

## 2022-09-05 DIAGNOSIS — F988 Other specified behavioral and emotional disorders with onset usually occurring in childhood and adolescence: Secondary | ICD-10-CM | POA: Diagnosis not present

## 2022-09-05 DIAGNOSIS — Z23 Encounter for immunization: Secondary | ICD-10-CM

## 2022-09-05 DIAGNOSIS — E66811 Obesity, class 1: Secondary | ICD-10-CM

## 2022-09-05 DIAGNOSIS — R7301 Impaired fasting glucose: Secondary | ICD-10-CM | POA: Diagnosis not present

## 2022-09-05 DIAGNOSIS — F341 Dysthymic disorder: Secondary | ICD-10-CM

## 2022-09-05 DIAGNOSIS — E669 Obesity, unspecified: Secondary | ICD-10-CM

## 2022-09-05 DIAGNOSIS — L659 Nonscarring hair loss, unspecified: Secondary | ICD-10-CM

## 2022-09-05 LAB — POCT GLYCOSYLATED HEMOGLOBIN (HGB A1C): Hemoglobin A1C: 5.5 % (ref 4.0–5.6)

## 2022-09-05 MED ORDER — LISDEXAMFETAMINE DIMESYLATE 20 MG PO CAPS: 20.0000 mg | ORAL_CAPSULE | Freq: Every day | ORAL | 0 refills | Status: DC

## 2022-09-05 NOTE — Assessment & Plan Note (Signed)
1C still looks good today.  Though up a little bit from previous.

## 2022-09-05 NOTE — Assessment & Plan Note (Signed)
Doing well on current regimen.  Refills sent to pharmacy.

## 2022-09-05 NOTE — Assessment & Plan Note (Signed)
Really made a lot of great changes and she has been exercising regularly but has not been able to really lose weight.  We did discuss checking her thyroid level.  We also discussed referral to healthy weight and wellness.

## 2022-09-05 NOTE — Assessment & Plan Note (Signed)
Having some persistent symptoms.  Continue with Wellbutrin.  [place] Office Visit from 09/05/2022 in [place]  PHQ-9 Total Score 8         09/05/2022    9:13 AM 02/25/2022    4:44 PM 09/27/2021    1:16 PM 07/26/2021    8:49 AM  GAD 7 : Generalized Anxiety Score  Nervous, Anxious, on Edge 1 1 1  0  Control/stop worrying 0 1 0 0  Worry too much - different things 1 1 1  0  Trouble relaxing 1 0 0 0  Restless 0 1 0 0  Easily annoyed or irritable 1 0 0 0  Afraid - awful might happen 1 0 0 0  Total GAD 7 Score 5 4 2  0  Anxiety Difficulty Somewhat difficult Somewhat difficult Somewhat difficult Not difficult at all

## 2022-09-05 NOTE — Progress Notes (Signed)
Established Patient Office Visit  Subjective   Patient ID: Taylor [name], female    DOB: 1981-08-23  Age: 41 y.o. MRN: [id number]  Chief Complaint  Patient presents with   ADD    HPI  ADD - Reports symptoms are well controlled on current regime. Denies any problems with insomnia, chest pain, palpitations, or SOB.    Impaired fasting glucose-no increased thirst or urination. No symptoms consistent with hypoglycemia.  Hair loss - Is also concerned about significant.  She feels like it is a little bit of hair loss all over it started after she had her gallbladder removed.  She also wanted to discuss difficulty losing weight.  She had previously gone to [facility] a while back and took phentermine and lost a lot of weight and calorie restricted down and her calories.  She gradually started gaining the weight back.  She went back and they put her on naltrexone which she took for 90 days and calorie restricted her 2000 cal/day and did not lose any weight.  She has been working out regularly she is even incorporated weight training she has been very consistent with her exercise for 3 months.  She started doing Noom about 4 weeks ago and is started to really incorporate more vegetables and fruits into the diet.    ROS    Objective:     BP 126/77   Pulse 85   Ht 5\' 5"  (1.651 m)   Wt 201 lb (91.2 kg)   SpO2 97%   BMI 33.45 kg/m    Physical Exam Vitals reviewed.  Constitutional:      Appearance: She is well-developed.  HENT:     Head: Normocephalic and atraumatic.  Eyes:     Conjunctiva/sclera: Conjunctivae normal.  Cardiovascular:     Rate and Rhythm: Normal rate.  Pulmonary:     Effort: Pulmonary effort is normal.  Skin:    General: Skin is dry.     Coloration: Skin is not pale.  Neurological:     Mental Status: She is alert and oriented to person, place, and time.  Psychiatric:        Behavior: Behavior normal.      Results for orders placed or performed in visit on  09/05/22  POCT glycosylated hemoglobin (Hb A1C)  Result Value Ref Range   Hemoglobin A1C 5.5 4.0 - 5.6 %   HbA1c POC (<> result, manual entry)     HbA1c, POC (prediabetic range)     HbA1c, POC (controlled diabetic range)        The 10-year ASCVD risk score (Arnett DK, et al., 2019) is: 1.7%    Assessment & Plan:   Problem List Items Addressed This Visit       Endocrine   Impaired fasting glucose    1C still looks good today.  Though up a little bit from previous.      Relevant Medications   lisdexamfetamine (VYVANSE) 20 MG capsule   Other Relevant Orders   POCT glycosylated hemoglobin (Hb A1C) (Completed)   TSH   Vitamin B1   B12   Fe+TIBC+Fer     Other   Obesity, Class I, BMI 30-34.9 - Primary    Really made a lot of great changes and she has been exercising regularly but has not been able to really lose weight.  We did discuss checking her thyroid level.  We also discussed referral to healthy weight and wellness.      Relevant Orders  Amb Ref to Medical Weight Management   Attention deficit disorder    Doing well on current regimen.  Refills sent to pharmacy.      Relevant Medications   lisdexamfetamine (VYVANSE) 20 MG capsule   lisdexamfetamine (VYVANSE) 20 MG capsule (Start on 10/04/2022)   lisdexamfetamine (VYVANSE) 20 MG capsule (Start on 11/03/2022)   ANXIETY DEPRESSION    Having some persistent symptoms.  Continue with Wellbutrin.  [place] Office Visit from 09/05/2022 in [place]  PHQ-9 Total Score 8         09/05/2022    9:13 AM 02/25/2022    4:44 PM 09/27/2021    1:16 PM 07/26/2021    8:49 AM  GAD 7 : Generalized Anxiety Score  Nervous, Anxious, on Edge 1 1 1  0  Control/stop worrying 0 1 0 0  Worry too much - different things 1 1 1  0  Trouble relaxing 1 0 0 0  Restless 0 1 0 0  Easily annoyed or irritable 1 0 0 0  Afraid - awful might happen 1 0 0 0  Total GAD 7 Score 5 4 2  0  Anxiety Difficulty  Somewhat difficult Somewhat difficult Somewhat difficult Not difficult at all         Other Visit Diagnoses     Need for tetanus, diphtheria, and acellular pertussis (Tdap) vaccine in patient of adolescent age or older       Relevant Orders   Tdap vaccine greater than or equal to 7yo IM (Completed)   Need for immunization against influenza       Relevant Orders   Flu Vaccine QUAD 35mo+IM (Fluarix, Fluzone & Alfiuria Quad PF) (Completed)   Hair loss       Relevant Orders   TSH   Vitamin B1   B12   Fe+TIBC+Fer      Hair loss-suspect directly related to having had surgery but we will check for thyroid disorder as well as for some mineral deficiencies.  Return in about 6 months (around 03/06/2023) for Medication refills Vyvanse.    Beatrice Lecher, MD

## 2022-09-06 NOTE — Progress Notes (Signed)
Taylor [name], thyroid looks perfect at 1.2.  B12 looks good.  Iron stores and iron levels are normal.  B1 is still pending.

## 2022-09-09 ENCOUNTER — Encounter: Payer: Self-pay | Admitting: Family Medicine

## 2022-09-09 DIAGNOSIS — E519 Thiamine deficiency, unspecified: Secondary | ICD-10-CM | POA: Insufficient documentation

## 2022-09-09 LAB — VITAMIN B12: Vitamin B-12: 432 pg/mL (ref 200–1100)

## 2022-09-09 LAB — IRON,TIBC AND FERRITIN PANEL
%SAT: 21 % (calc) (ref 16–45)
Ferritin: 55 ng/mL (ref 16–232)
Iron: 82 ug/dL (ref 40–190)
TIBC: 396 mcg/dL (calc) (ref 250–450)

## 2022-09-09 LAB — VITAMIN B1: Vitamin B1 (Thiamine): 7 nmol/L — ABNORMAL LOW (ref 8–30)

## 2022-09-09 LAB — TSH: TSH: 1.24 mIU/L

## 2022-09-09 NOTE — Progress Notes (Signed)
Hi Taylor [name],  Your vitamin B1 level is low.  I would recommend starting an over-the-counter B1.  It is also called thiamine.  Plan will be to recheck her levels in 2 to 3 months just to make sure that your body is absorbing it properly.

## 2022-10-03 ENCOUNTER — Ambulatory Visit (INDEPENDENT_AMBULATORY_CARE_PROVIDER_SITE_OTHER): Payer: No Typology Code available for payment source | Admitting: Bariatrics

## 2022-10-03 ENCOUNTER — Encounter: Payer: Self-pay | Admitting: Bariatrics

## 2022-10-03 VITALS — BP 127/82 | HR 87 | Temp 97.8°F | Ht 65.0 in | Wt 198.0 lb

## 2022-10-03 DIAGNOSIS — R7301 Impaired fasting glucose: Secondary | ICD-10-CM | POA: Diagnosis not present

## 2022-10-03 DIAGNOSIS — R5383 Other fatigue: Secondary | ICD-10-CM

## 2022-10-03 DIAGNOSIS — E669 Obesity, unspecified: Secondary | ICD-10-CM

## 2022-10-03 DIAGNOSIS — Z6833 Body mass index (BMI) 33.0-33.9, adult: Secondary | ICD-10-CM

## 2022-10-03 DIAGNOSIS — E611 Iron deficiency: Secondary | ICD-10-CM | POA: Diagnosis not present

## 2022-10-17 ENCOUNTER — Ambulatory Visit: Payer: No Typology Code available for payment source | Admitting: Bariatrics

## 2022-10-17 ENCOUNTER — Encounter: Payer: Self-pay | Admitting: Bariatrics

## 2022-10-17 DIAGNOSIS — Z0289 Encounter for other administrative examinations: Secondary | ICD-10-CM

## 2022-10-19 ENCOUNTER — Encounter: Payer: Self-pay | Admitting: Bariatrics

## 2022-10-19 ENCOUNTER — Ambulatory Visit (INDEPENDENT_AMBULATORY_CARE_PROVIDER_SITE_OTHER): Payer: No Typology Code available for payment source | Admitting: Bariatrics

## 2022-10-19 VITALS — BP 114/80 | HR 86 | Temp 98.4°F | Ht 65.0 in | Wt 196.0 lb

## 2022-10-19 DIAGNOSIS — Z6833 Body mass index (BMI) 33.0-33.9, adult: Secondary | ICD-10-CM

## 2022-10-19 DIAGNOSIS — E282 Polycystic ovarian syndrome: Secondary | ICD-10-CM

## 2022-10-19 DIAGNOSIS — E669 Obesity, unspecified: Secondary | ICD-10-CM

## 2022-10-19 DIAGNOSIS — R7301 Impaired fasting glucose: Secondary | ICD-10-CM | POA: Diagnosis not present

## 2022-10-19 DIAGNOSIS — R5383 Other fatigue: Secondary | ICD-10-CM

## 2022-10-19 DIAGNOSIS — E519 Thiamine deficiency, unspecified: Secondary | ICD-10-CM | POA: Diagnosis not present

## 2022-10-19 DIAGNOSIS — Z0000 Encounter for general adult medical examination without abnormal findings: Secondary | ICD-10-CM | POA: Insufficient documentation

## 2022-10-19 DIAGNOSIS — K581 Irritable bowel syndrome with constipation: Secondary | ICD-10-CM | POA: Diagnosis not present

## 2022-10-19 DIAGNOSIS — R0602 Shortness of breath: Secondary | ICD-10-CM | POA: Insufficient documentation

## 2022-10-19 DIAGNOSIS — Z1331 Encounter for screening for depression: Secondary | ICD-10-CM | POA: Diagnosis not present

## 2022-10-19 NOTE — Progress Notes (Signed)
Office: [phone]  /  Fax: [phone]   Initial Visit  Taylor [name] was seen in clinic today to evaluate for obesity. She is interested in losing weight to improve overall health and reduce the risk of weight related complications. She presents today to review program treatment options, initial physical assessment, and evaluation.     She was referred by: PCP  When asked what else they would like to accomplish? She states: Adopt healthier eating patterns, Improve quality of life, and Improve self-confidence  When asked how has your weight affected you? She states: Having fatigue  Some associated conditions: Other: impaired fasting glucose  Contributing factors: Family history  Weight promoting medications identified: None  Current nutrition plan: Low-carb and High-protein  Current level of physical activity: NEAT  Current or previous pharmacotherapy: Phentermine, Buproprion, and Other: Naltrexone  Response to medication: Ineffective so it was discontinued   Past medical history includes:   Past Medical History:  Diagnosis Date   Anxiety    Constipation    Depression    GERD (gastroesophageal reflux disease)    IBS (irritable bowel syndrome)    Impaired fasting glucose    PCOS (polycystic ovarian syndrome)    Pre-diabetes    Sleep apnea    Vitamin D deficiency      Objective:   BP 127/82   Pulse 87   Temp 97.8 F (36.6 C)   Ht 5\' 5"  (1.651 m)   Wt 198 lb (89.8 kg)   SpO2 99%   BMI 32.95 kg/m  She was weighed on the bioimpedance scale: Body mass index is 32.95 kg/m. Visceral Fat Rating:9, Body Fat%:38.4.  General:  Alert, oriented and cooperative. Patient is in no acute distress.  Respiratory: Normal respiratory effort, no problems with respiration noted  Extremities: Normal range of motion.    Mental Status: Normal mood and affect. Normal behavior. Normal judgment and thought content.   Assessment and Plan:  1. Impaired fasting glucose Taylor [name]  has a maternal family history. She is not on medications currently. Her last A1c was 5.5.  She will minimize all carbohydrates (sweets and starches).   2. Other fatigue Taylor [name] notes her energy level is medium, and she notes fatigue with certain activities.   EKG and labs will be obtained in the near future.   3. Iron deficiency Taylor [name] notes heavy menses. She is not on medications currently.   We will obtain fasting labs in the near future.   4. Obesity, Current BMI 33.0 Taylor [name] will return to the clinic in 2-3 weeks for an appointment. She will keep carbohydrates low and protein high.    We reviewed weight, biometrics, associated medical conditions and contributing factors with patient. She would benefit from weight loss therapy via a modified calorie, low-carb, high-protein nutritional plan tailored to their REE (resting energy expenditure) which will be determined by indirect calorimetry.  We will also assess for cardiometabolic risk and nutritional derangements via fasting serologies at her next appointment.      Obesity Treatment / Action Plan:  Will work on eliminating or reducing the presence of highly palatable, calorie dense foods in the home. Will be scheduled for indirect calorimetry to determine resting energy expenditure in a fasting state.  This will allow Taylor [name] to create a reduced calorie, high-protein meal plan to promote loss of fat mass while preserving muscle mass. Will work on reducing intake of added sugars, simple sugars and processed carbs. Was counseled on nutritional approaches to weight loss and benefits of complex  carbs and high quality protein as part of nutritional weight management. Was counseled on pharmacotherapy and role as an adjunct in weight management.  Will work on increasing water intake with a goal of 125 ounces for men and 91 ounces for women.   Obesity Education Performed Today:  She was weighed on the bioimpedance scale and results were  discussed and documented in the synopsis.  We discussed obesity as a disease and the importance of a more detailed evaluation of all the factors contributing to the disease.  We discussed the importance of long term lifestyle changes which include nutrition, exercise and behavioral modifications as well as the importance of customizing this to her specific health and social needs.  We discussed the benefits of reaching a healthier weight to alleviate the symptoms of existing conditions and reduce the risks of the biomechanical, metabolic and psychological effects of obesity.  Zillah Alexie appears to be in the action stage of change and states they are ready to start intensive lifestyle modifications and behavioral modifications.   Reviewed by clinician on day of visit: allergies, medications, problem list, medical history, surgical history, family history, social history, and previous encounter notes.   Trude Mcburney, am acting as transcriptionist for [employer], DO   I have reviewed the above documentation for accuracy and completeness, and I agree with the above. Corinna Capra, DO

## 2022-10-25 LAB — COMPREHENSIVE METABOLIC PANEL
ALT: 14 IU/L (ref 0–32)
AST: 18 IU/L (ref 0–40)
Albumin/Globulin Ratio: 1.6 (ref 1.2–2.2)
Albumin: 4.3 g/dL (ref 3.9–4.9)
Alkaline Phosphatase: 73 IU/L (ref 44–121)
BUN/Creatinine Ratio: 14 (ref 9–23)
BUN: 12 mg/dL (ref 6–24)
Bilirubin Total: 0.2 mg/dL (ref 0.0–1.2)
CO2: 16 mmol/L — ABNORMAL LOW (ref 20–29)
Calcium: 9.1 mg/dL (ref 8.7–10.2)
Chloride: 106 mmol/L (ref 96–106)
Creatinine, Ser: 0.88 mg/dL (ref 0.57–1.00)
Globulin, Total: 2.7 g/dL (ref 1.5–4.5)
Glucose: 98 mg/dL (ref 70–99)
Potassium: 4.7 mmol/L (ref 3.5–5.2)
Sodium: 137 mmol/L (ref 134–144)
Total Protein: 7 g/dL (ref 6.0–8.5)
eGFR: 84 mL/min/{1.73_m2} (ref >59)

## 2022-10-25 LAB — VITAMIN B1: Thiamine: 251.4 nmol/L — ABNORMAL HIGH (ref 66.5–200.0)

## 2022-10-25 LAB — INSULIN, RANDOM: INSULIN: 10.6 u[IU]/mL (ref 2.6–24.9)

## 2022-11-02 ENCOUNTER — Encounter: Payer: Self-pay | Admitting: Bariatrics

## 2022-11-02 ENCOUNTER — Ambulatory Visit (INDEPENDENT_AMBULATORY_CARE_PROVIDER_SITE_OTHER): Payer: No Typology Code available for payment source | Admitting: Bariatrics

## 2022-11-02 VITALS — BP 139/86 | HR 84 | Temp 98.2°F | Ht 65.0 in | Wt 199.0 lb

## 2022-11-02 DIAGNOSIS — E282 Polycystic ovarian syndrome: Secondary | ICD-10-CM

## 2022-11-02 DIAGNOSIS — E669 Obesity, unspecified: Secondary | ICD-10-CM | POA: Diagnosis not present

## 2022-11-02 DIAGNOSIS — K581 Irritable bowel syndrome with constipation: Secondary | ICD-10-CM | POA: Diagnosis not present

## 2022-11-02 DIAGNOSIS — Z6833 Body mass index (BMI) 33.0-33.9, adult: Secondary | ICD-10-CM

## 2022-11-02 DIAGNOSIS — E519 Thiamine deficiency, unspecified: Secondary | ICD-10-CM

## 2022-11-07 NOTE — Patient Instructions (Signed)

## 2022-11-08 NOTE — Progress Notes (Unsigned)
Chief Complaint:   OBESITY Taylor [name] (MR# [id number]) is a 42 y.o. female who presents for evaluation and treatment of obesity and related comorbidities. Current BMI is Body mass index is 32.62 kg/m. Taylor [name] has been struggling with her weight for many years and has been unsuccessful in either losing weight, maintaining weight loss, or reaching her healthy weight goal.  Taylor [name] was seen as at an information session on 10/03/2022. She states that she does not really like to cook due to the time restraints.   Taylor [name] is currently in the action stage of change and ready to dedicate time achieving and maintaining a healthier weight. Taylor [name] is interested in becoming our patient and working on intensive lifestyle modifications including (but not limited to) diet and exercise for weight loss.  Taylor [name]'s habits were reviewed today and are as follows: {MWM WT HABITS:23461}.  Depression Screen Taylor [name]'s Food and Mood (modified PHQ-9) score was 10.  Subjective:   1. Other fatigue Taylor [name] admits to daytime somnolence and admits to waking up still tired. Patient has a history of symptoms of daytime fatigue and morning fatigue. Taylor [name] generally gets 6 or 8 hours of sleep per night, and states that she has nightime awakenings. Snoring is present. Apneic episodes are present. Epworth Sleepiness Score is 13.   2. SOB (shortness of breath) on exertion Taylor [name] notes increasing shortness of breath with exercising and seems to be worsening over time with weight gain. She notes getting out of breath sooner with activity than she used to. This has not gotten worse recently. Taylor [name] denies shortness of breath at rest or orthopnea.  3. PCOS (polycystic ovarian syndrome) ***  4. IFG (impaired fasting glucose) ***  5. Irritable bowel syndrome with constipation ***  6. Vitamin B1 deficiency ***  7. Health care maintenance Given obesity.   Assessment/Plan:   1. Other fatigue Taylor [name] does  feel that her weight is causing her energy to be lower than it should be. Fatigue may be related to obesity, depression or many other causes. Labs will be ordered, and in the meanwhile, Taylor [name] will focus on self care including making healthy food choices, increasing physical activity and focusing on stress reduction.  - EKG 12-Lead - Comprehensive metabolic panel - Insulin, random  2. SOB (shortness of breath) on exertion Taylor [name] does feel that she gets out of breath more easily that she used to when she exercises. Taylor [name]'s shortness of breath appears to be obesity related and exercise induced. She has agreed to work on weight loss and gradually increase exercise to treat her exercise induced shortness of breath. Will continue to monitor closely.  3. PCOS (polycystic ovarian syndrome) *** - Comprehensive metabolic panel - Insulin, random  4. IFG (impaired fasting glucose) ***  5. Irritable bowel syndrome with constipation ***  6. Vitamin B1 deficiency *** - Vitamin B1  7. Health care maintenance *** - Comprehensive metabolic panel - Insulin, random  8. Depression screening Taylor [name] had a positive depression screening. Depression is commonly associated with obesity and often results in emotional eating behaviors. We will monitor this closely and work on CBT to help improve the non-hunger eating patterns. Referral to Psychology may be required if no improvement is seen as she continues in our clinic.  9. Obesity, Current BMI 32.6 Taylor [name] is currently in the action stage of change and her goal is to continue with weight loss efforts. I recommend Taylor [name] begin the structured treatment plan as follows:  She has agreed to the Category  2 Plan + 100 calories and keeping a food journal and adhering to recommended goals of 1300 calories and 75-90 grams of protein daily.  Meal planning was discussed. Reviewed labs with the patient from 09/05/2022. Iron, anemia panel, B1, B12, and TSH. She  will not skip breakfast.   Exercise goals: Elliptical    Behavioral modification strategies: increasing lean protein intake, decreasing simple carbohydrates, increasing vegetables, increasing water intake, decreasing eating out, no skipping meals, meal planning and cooking strategies, keeping healthy foods in the home, and planning for success.  She was informed of the importance of frequent follow-up visits to maximize her success with intensive lifestyle modifications for her multiple health conditions. She was informed we would discuss her lab results at her next visit unless there is a critical issue that needs to be addressed sooner. Taylor [name] agreed to keep her next visit at the agreed upon time to discuss these results.  Objective:   Blood pressure 114/80, pulse 86, temperature 98.4 F (36.9 C), height 5\' 5"  (1.651 m), weight 196 lb (88.9 kg), SpO2 99 %. Body mass index is 32.62 kg/m.  EKG: Normal sinus rhythm, rate 95 BPM.  Indirect Calorimeter completed today shows a VO2 of 260 and a REE of 1800.  Her calculated basal metabolic rate is thus her basal metabolic rate is better than expected.  General: Cooperative, alert, well developed, in no acute distress. HEENT: Conjunctivae and lids unremarkable. Cardiovascular: Regular rhythm.  Lungs: Normal work of breathing. Neurologic: No focal deficits.   Lab Results  Component Value Date   CREATININE 0.88 10/19/2022   BUN 12 10/19/2022   NA 137 10/19/2022   K 4.7 10/19/2022   CL 106 10/19/2022   CO2 16 (L) 10/19/2022   Lab Results  Component Value Date   ALT 14 10/19/2022   AST 18 10/19/2022   ALKPHOS 73 10/19/2022   BILITOT 0.2 10/19/2022   Lab Results  Component Value Date   HGBA1C 5.5 09/05/2022   HGBA1C 5.0 07/26/2021   HGBA1C 5.5 12/03/2020   HGBA1C 5.2 08/06/2019   HGBA1C 5.6 08/08/2017   Lab Results  Component Value Date   INSULIN 10.6 10/19/2022   INSULIN 14.3 08/10/2016   Lab Results  Component Value  Date   TSH 1.24 09/05/2022   Lab Results  Component Value Date   CHOL 180 12/03/2020   HDL 62 12/03/2020   LDLCALC 99 12/03/2020   TRIG 94 12/03/2020   CHOLHDL 2.9 12/03/2020   Lab Results  Component Value Date   WBC 9.3 12/08/2021   HGB 14.2 12/08/2021   HCT 42.6 12/08/2021   MCV 97.5 12/08/2021   PLT 263 12/08/2021   Lab Results  Component Value Date   IRON 82 09/05/2022   TIBC 396 09/05/2022   FERRITIN 55 09/05/2022   Attestation Statements:   Reviewed by clinician on day of visit: allergies, medications, problem list, medical history, surgical history, family history, social history, and previous encounter notes.   13/03/2022, am acting as Trude Mcburney for [employer], DO.  I have reviewed the above documentation for accuracy and completeness, and I agree with the above. - ***

## 2022-11-16 NOTE — Progress Notes (Signed)
Chief Complaint:   OBESITY Taylor [name] is here to discuss her progress with her obesity treatment plan along with follow-up of her obesity related diagnoses. Taylor [name] is on the Category 2 Plan + 100 calories and keeping a food journal and adhering to recommended goals of 1300 calories and 75-90 grams of protein and states she is following her eating plan approximately 0% of the time. Taylor [name] states she is doing 0 minutes 0 times per week.  Today's visit was #: 2 Starting weight: 196 lbs Starting date: 10/19/2022 Today's weight: 199 lbs Today's date: 11/02/2022 Total lbs lost to date: 0 Total lbs lost since last in-office visit: 0  Interim History: Taylor [name] states that she gained weight over the holidays.  She is up 3 pounds since her last visit.  She has been traveling and she is up about 2-1/2 pounds of water weight per our bioimpedance scale.  Subjective:   1. PCOS (polycystic ovarian syndrome) Taylor [name]'s recent insulin level was 10.6.  2. Irritable bowel syndrome with constipation Taylor [name] symptoms are reasonably well-controlled.  3. Vitamin B1 deficiency Taylor [name] is currently taking B1 supplement.  Assessment/Plan:   1. PCOS (polycystic ovarian syndrome) Taylor [name] will work on decreasing all carbohydrates (sweets and starches).   2. Irritable bowel syndrome with constipation Taylor [name] will work on increasing her water, fruits, and vegetable intake.  3. Vitamin B1 deficiency Taylor [name] will continue to take B1 supplementation 3 times per week.  4. Obesity, Current BMI 33.2 Taylor [name] is currently in the action stage of change. As such, her goal is to continue with weight loss efforts. She has agreed to the Category 2 Plan.   She will start adhering closely to the plan 80-90%.  Review labs with the patient from 10/19/2022, CMP, thyroid panel, glucose, and insulin.  Increase water and protein.  Exercise goals: She will start back with her weight training.   Behavioral modification  strategies: increasing lean protein intake, decreasing simple carbohydrates, increasing vegetables, increasing water intake, decreasing eating out, no skipping meals, meal planning and cooking strategies, keeping healthy foods in the home, and planning for success.  Taylor [name] has agreed to follow-up with our clinic in 2 weeks. She was informed of the importance of frequent follow-up visits to maximize her success with intensive lifestyle modifications for her multiple health conditions.   Objective:   Blood pressure 139/86, pulse 84, temperature 98.2 F (36.8 C), height 5\' 5"  (1.651 m), weight 199 lb (90.3 kg), SpO2 97 %. Body mass index is 33.12 kg/m.  General: Cooperative, alert, well developed, in no acute distress. HEENT: Conjunctivae and lids unremarkable. Cardiovascular: Regular rhythm.  Lungs: Normal work of breathing. Neurologic: No focal deficits.   Lab Results  Component Value Date   CREATININE 0.88 10/19/2022   BUN 12 10/19/2022   NA 137 10/19/2022   K 4.7 10/19/2022   CL 106 10/19/2022   CO2 16 (L) 10/19/2022   Lab Results  Component Value Date   ALT 14 10/19/2022   AST 18 10/19/2022   ALKPHOS 73 10/19/2022   BILITOT 0.2 10/19/2022   Lab Results  Component Value Date   HGBA1C 5.5 09/05/2022   HGBA1C 5.0 07/26/2021   HGBA1C 5.5 12/03/2020   HGBA1C 5.2 08/06/2019   HGBA1C 5.6 08/08/2017   Lab Results  Component Value Date   INSULIN 10.6 10/19/2022   INSULIN 14.3 08/10/2016   Lab Results  Component Value Date   TSH 1.24 09/05/2022   Lab Results  Component Value Date   CHOL  180 12/03/2020   HDL 62 12/03/2020   LDLCALC 99 12/03/2020   TRIG 94 12/03/2020   CHOLHDL 2.9 12/03/2020   Lab Results  Component Value Date   VD25OH 46.5 08/10/2016   Lab Results  Component Value Date   WBC 9.3 12/08/2021   HGB 14.2 12/08/2021   HCT 42.6 12/08/2021   MCV 97.5 12/08/2021   PLT 263 12/08/2021   Lab Results  Component Value Date   IRON 82 09/05/2022    TIBC 396 09/05/2022   FERRITIN 55 09/05/2022   Attestation Statements:   Reviewed by clinician on day of visit: allergies, medications, problem list, medical history, surgical history, family history, social history, and previous encounter notes.   Wilhemena Durie, am acting as Location manager for [employer], DO.  I have reviewed the above documentation for accuracy and completeness, and I agree with the above. Jearld Lesch, DO

## 2022-11-17 ENCOUNTER — Encounter: Payer: Self-pay | Admitting: Bariatrics

## 2022-11-21 ENCOUNTER — Ambulatory Visit: Payer: No Typology Code available for payment source | Admitting: Bariatrics

## 2022-11-28 ENCOUNTER — Encounter: Payer: Self-pay | Admitting: Bariatrics

## 2022-11-28 ENCOUNTER — Ambulatory Visit: Payer: No Typology Code available for payment source | Admitting: Bariatrics

## 2022-11-28 VITALS — BP 139/83 | HR 96 | Temp 98.3°F | Ht 65.0 in | Wt 200.0 lb

## 2022-11-28 DIAGNOSIS — R7301 Impaired fasting glucose: Secondary | ICD-10-CM | POA: Diagnosis not present

## 2022-11-28 DIAGNOSIS — Z6833 Body mass index (BMI) 33.0-33.9, adult: Secondary | ICD-10-CM | POA: Insufficient documentation

## 2022-11-28 DIAGNOSIS — R632 Polyphagia: Secondary | ICD-10-CM

## 2022-11-28 DIAGNOSIS — E669 Obesity, unspecified: Secondary | ICD-10-CM

## 2022-11-28 DIAGNOSIS — Z6832 Body mass index (BMI) 32.0-32.9, adult: Secondary | ICD-10-CM

## 2022-11-28 DIAGNOSIS — E282 Polycystic ovarian syndrome: Secondary | ICD-10-CM

## 2022-11-28 MED ORDER — TOPIRAMATE 50 MG PO TABS: 50.0000 mg | ORAL_TABLET | Freq: Two times a day (BID) | ORAL | 0 refills | Status: DC

## 2022-12-12 ENCOUNTER — Ambulatory Visit: Payer: No Typology Code available for payment source | Admitting: Nurse Practitioner

## 2022-12-14 NOTE — Progress Notes (Unsigned)
Chief Complaint:   OBESITY Taylor [name] is here to discuss her progress with her obesity treatment plan along with follow-up of her obesity related diagnoses. Taylor [name] is on the Category 2 Plan and states she is following her eating plan approximately 50% of the time. Taylor [name] states she is doing 0 minutes 0 times per week.  Today's visit was #: 3 Starting weight: 196 lbs Starting date: 10/19/2022 Today's weight: 200 lbs Today's date: 11/28/2022 Total lbs lost to date: 0 Total lbs lost since last in-office visit: 0  Interim History: Taylor [name] is up 1 lb since her last visit. She will do several days and do well. She is doing well with her water and protein.   Subjective:   1. PCOS (polycystic ovarian syndrome) Taylor [name] is not on specific medications.  2. Impaired fasting glucose Taylor [name] has UTD on her labs.  3. Binge eating Taylor [name] takes Vyvanse intermittently.  She notes occasional binge eating, and increased stress and emotional eating.  Assessment/Plan:   1. PCOS (polycystic ovarian syndrome) Taylor [name] will work on increasing her exercise, and she will keep all carbohydrates low.  2. Impaired fasting glucose Taylor [name] will work on minimizing all carbohydrates (sweets and starches).   3. Binge eating Taylor [name] agreed to start Topamax 50 mg twice daily with no refills.  - topiramate (TOPAMAX) 50 MG tablet; Take 1 tablet (50 mg total) by mouth 2 (two) times daily.  Dispense: 60 tablet; Refill: 0  4. Generalized obesity  5. BMI 32.0-32.9,adult Taylor [name] is currently in the action stage of change. As such, her goal is to continue with weight loss efforts. She has agreed to the Category 2 Plan.   Meal planning and intentional eating were discussed. She will not keep tempting foods in the house. She will try to stay on the plan 80%.   Exercise goals: No exercise has been prescribed at this time.  Behavioral modification strategies: increasing lean protein intake, decreasing simple  carbohydrates, increasing vegetables, increasing water intake, decreasing eating out, no skipping meals, meal planning and cooking strategies, keeping healthy foods in the home, and planning for success.  Taylor [name] has agreed to follow-up with our clinic in 2 weeks. She was informed of the importance of frequent follow-up visits to maximize her success with intensive lifestyle modifications for her multiple health conditions.   Objective:   Blood pressure 139/83, pulse 96, temperature 98.3 F (36.8 C), height 5' 5"$  (1.651 m), weight 200 lb (90.7 kg), SpO2 98 %. Body mass index is 33.28 kg/m.  General: Cooperative, alert, well developed, in no acute distress. HEENT: Conjunctivae and lids unremarkable. Cardiovascular: Regular rhythm.  Lungs: Normal work of breathing. Neurologic: No focal deficits.   Lab Results  Component Value Date   CREATININE 0.88 10/19/2022   BUN 12 10/19/2022   NA 137 10/19/2022   K 4.7 10/19/2022   CL 106 10/19/2022   CO2 16 (L) 10/19/2022   Lab Results  Component Value Date   ALT 14 10/19/2022   AST 18 10/19/2022   ALKPHOS 73 10/19/2022   BILITOT 0.2 10/19/2022   Lab Results  Component Value Date   HGBA1C 5.5 09/05/2022   HGBA1C 5.0 07/26/2021   HGBA1C 5.5 12/03/2020   HGBA1C 5.2 08/06/2019   HGBA1C 5.6 08/08/2017   Lab Results  Component Value Date   INSULIN 10.6 10/19/2022   INSULIN 14.3 08/10/2016   Lab Results  Component Value Date   TSH 1.24 09/05/2022   Lab Results  Component Value Date  CHOL 180 12/03/2020   HDL 62 12/03/2020   LDLCALC 99 12/03/2020   TRIG 94 12/03/2020   CHOLHDL 2.9 12/03/2020   Lab Results  Component Value Date   VD25OH 46.5 08/10/2016   Lab Results  Component Value Date   WBC 9.3 12/08/2021   HGB 14.2 12/08/2021   HCT 42.6 12/08/2021   MCV 97.5 12/08/2021   PLT 263 12/08/2021   Lab Results  Component Value Date   IRON 82 09/05/2022   TIBC 396 09/05/2022   FERRITIN 55 09/05/2022   Attestation  Statements:   Reviewed by clinician on day of visit: allergies, medications, problem list, medical history, surgical history, family history, social history, and previous encounter notes.   Wilhemena Durie, am acting as Location manager for [employer], DO.  I have reviewed the above documentation for accuracy and completeness, and I agree with the above. Jearld Lesch, DO

## 2022-12-15 ENCOUNTER — Encounter: Payer: Self-pay | Admitting: Bariatrics

## 2022-12-20 ENCOUNTER — Other Ambulatory Visit: Payer: Self-pay | Admitting: Family Medicine

## 2022-12-20 DIAGNOSIS — R7301 Impaired fasting glucose: Secondary | ICD-10-CM

## 2022-12-20 DIAGNOSIS — F988 Other specified behavioral and emotional disorders with onset usually occurring in childhood and adolescence: Secondary | ICD-10-CM

## 2022-12-20 MED ORDER — ADZENYS XR-ODT 12.5 MG PO TBED: 1.0000 | EXTENDED_RELEASE_TABLET | ORAL | 0 refills | Status: DC

## 2022-12-20 NOTE — Telephone Encounter (Signed)
Last OV: 09/05/22 Next OV: 03/06/23 Last RF: 09/05/22

## 2022-12-20 NOTE — Telephone Encounter (Signed)
Sent over a different ADD medication called Adzenys.  She should look online for a coupon.    . Meds ordered this encounter  Medications   Amphetamine ER (ADZENYS XR-ODT) 12.5 MG TBED    Sig: Take 1 tablet by mouth every morning.    Dispense:  30 tablet    Refill:  0   Amphetamine ER (ADZENYS XR-ODT) 12.5 MG TBED    Sig: Take 1 tablet by mouth every morning.    Dispense:  30 tablet    Refill:  0   Amphetamine ER (ADZENYS XR-ODT) 12.5 MG TBED    Sig: Take 1 tablet by mouth every morning.    Dispense:  30 tablet    Refill:  0

## 2022-12-21 ENCOUNTER — Other Ambulatory Visit: Payer: Self-pay | Admitting: Bariatrics

## 2022-12-21 DIAGNOSIS — R632 Polyphagia: Secondary | ICD-10-CM

## 2022-12-26 ENCOUNTER — Other Ambulatory Visit: Payer: Self-pay | Admitting: Bariatrics

## 2022-12-26 ENCOUNTER — Encounter: Payer: Self-pay | Admitting: Bariatrics

## 2022-12-26 ENCOUNTER — Ambulatory Visit: Payer: No Typology Code available for payment source | Admitting: Bariatrics

## 2022-12-26 VITALS — BP 139/83 | HR 89 | Temp 98.0°F | Ht 65.0 in | Wt 203.0 lb

## 2022-12-26 DIAGNOSIS — R632 Polyphagia: Secondary | ICD-10-CM

## 2022-12-26 DIAGNOSIS — E282 Polycystic ovarian syndrome: Secondary | ICD-10-CM

## 2022-12-26 DIAGNOSIS — E669 Obesity, unspecified: Secondary | ICD-10-CM | POA: Diagnosis not present

## 2022-12-26 DIAGNOSIS — Z6833 Body mass index (BMI) 33.0-33.9, adult: Secondary | ICD-10-CM

## 2022-12-26 MED ORDER — NALTREXONE HCL 50 MG PO TABS: 25.0000 mg | ORAL_TABLET | Freq: Every day | ORAL | 0 refills | Status: DC
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
## 2022-12-27 ENCOUNTER — Encounter: Payer: Self-pay | Admitting: Bariatrics

## 2023-01-02 ENCOUNTER — Telehealth (INDEPENDENT_AMBULATORY_CARE_PROVIDER_SITE_OTHER): Payer: Self-pay | Admitting: Psychology

## 2023-01-03 ENCOUNTER — Telehealth (INDEPENDENT_AMBULATORY_CARE_PROVIDER_SITE_OTHER): Payer: No Typology Code available for payment source | Admitting: Psychology

## 2023-01-03 DIAGNOSIS — F32A Depression, unspecified: Secondary | ICD-10-CM

## 2023-01-03 DIAGNOSIS — F5089 Other specified eating disorder: Secondary | ICD-10-CM

## 2023-01-03 DIAGNOSIS — F419 Anxiety disorder, unspecified: Secondary | ICD-10-CM | POA: Diagnosis not present

## 2023-01-03 DIAGNOSIS — F909 Attention-deficit hyperactivity disorder, unspecified type: Secondary | ICD-10-CM | POA: Diagnosis not present
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
## 2023-01-05 ENCOUNTER — Encounter: Payer: Self-pay | Admitting: Family Medicine

## 2023-01-06 MED ORDER — METHYLPHENIDATE HCL ER (OSM) 27 MG PO TBCR: 27.0000 mg | EXTENDED_RELEASE_TABLET | ORAL | 0 refills | Status: DC

## 2023-01-06 NOTE — Telephone Encounter (Signed)
Meds ordered this encounter  Medications   methylphenidate (CONCERTA) 27 MG PO CR tablet    Sig: Take 1 tablet (27 mg total) by mouth every morning.    Dispense:  30 tablet    Refill:  0    

## 2023-01-09 ENCOUNTER — Ambulatory Visit: Payer: No Typology Code available for payment source | Admitting: Bariatrics

## 2023-01-09 ENCOUNTER — Encounter: Payer: Self-pay | Admitting: Bariatrics

## 2023-01-09 VITALS — BP 145/84 | HR 90 | Temp 97.9°F | Ht 65.0 in | Wt 204.0 lb

## 2023-01-09 DIAGNOSIS — R632 Polyphagia: Secondary | ICD-10-CM

## 2023-01-09 DIAGNOSIS — E669 Obesity, unspecified: Secondary | ICD-10-CM | POA: Diagnosis not present

## 2023-01-09 DIAGNOSIS — Z6833 Body mass index (BMI) 33.0-33.9, adult: Secondary | ICD-10-CM

## 2023-01-09 DIAGNOSIS — E282 Polycystic ovarian syndrome: Secondary | ICD-10-CM | POA: Diagnosis not present

## 2023-01-10 ENCOUNTER — Encounter: Payer: Self-pay | Admitting: Bariatrics

## 2023-01-10 NOTE — Progress Notes (Signed)
Chief Complaint:   OBESITY Taylor [name] is here to discuss her progress with her obesity treatment plan along with follow-up of her obesity related diagnoses. Taylor [name] is on the Category 2 plan and states she is following her eating plan approximately 50% of the time. Taylor [name] states she is doing the elliptical for 20-30 minutes 2 times per week.  Today's visit was #: 4 Starting weight: 196 lbs Starting date: 10/19/22 Today's weight: 203 lbs Today's date: 12/26/22 Total lbs lost to date: 0 Total lbs lost since last in-office visit: +3  Interim History: She is up 3 pounds since her last visit.  She tried the Topamax but had side effects-fatigue.  She is doing well with protein.  Subjective:   1. PCOS (polycystic ovarian syndrome) No medications.  2. Binge eating Increased stress.  Taking Wellbutrin/started Topamax at last visit been unable to tolerate.   Assessment/Plan:   1. PCOS (polycystic ovarian syndrome) 1.  Will keep all carbohydrates low (starches and sweets).  2. Binge eating New prescription- - naltrexone (DEPADE) 50 MG tablet; Take 0.5 tablets (25 mg total) by mouth daily.  Dispense: 15 tablet; Refill: 0  3. Generalized obesity BMI 33.0-33.9,adult 1.  Will adhere closely to the plan. 2.  Increase water.try app " Drink Water".  Taylor [name] is currently in the action stage of change. As such, her goal is to continue with weight loss efforts. She has agreed to the Category 2 plan.  Exercise goals: Continue exercise.  Behavioral modification strategies: increasing lean protein intake, decreasing simple carbohydrates, increasing vegetables, increasing water intake, decreasing eating out, no skipping meals, meal planning and cooking strategies, keeping healthy foods in the home, and planning for success.  Taylor [name] has agreed to follow-up with our clinic in 2 weeks with Colletta Maryland and 2 weeks with Dr. Mallie Mussel.  She was informed of the importance of frequent follow-up visits  to maximize her success with intensive lifestyle modifications for her multiple health conditions.  Objective:   Blood pressure 139/83, pulse 89, temperature 98 F (36.7 C), height '5\' 5"'$  (1.651 m), weight 203 lb (92.1 kg), SpO2 98 %. Body mass index is 33.78 kg/m.  General: Cooperative, alert, well developed, in no acute distress. HEENT: Conjunctivae and lids unremarkable. Cardiovascular: Regular rhythm.  Lungs: Normal work of breathing. Neurologic: No focal deficits.   Lab Results  Component Value Date   CREATININE 0.88 10/19/2022   BUN 12 10/19/2022   NA 137 10/19/2022   K 4.7 10/19/2022   CL 106 10/19/2022   CO2 16 (L) 10/19/2022   Lab Results  Component Value Date   ALT 14 10/19/2022   AST 18 10/19/2022   ALKPHOS 73 10/19/2022   BILITOT 0.2 10/19/2022   Lab Results  Component Value Date   HGBA1C 5.5 09/05/2022   HGBA1C 5.0 07/26/2021   HGBA1C 5.5 12/03/2020   HGBA1C 5.2 08/06/2019   HGBA1C 5.6 08/08/2017   Lab Results  Component Value Date   INSULIN 10.6 10/19/2022   INSULIN 14.3 08/10/2016   Lab Results  Component Value Date   TSH 1.24 09/05/2022   Lab Results  Component Value Date   CHOL 180 12/03/2020   HDL 62 12/03/2020   LDLCALC 99 12/03/2020   TRIG 94 12/03/2020   CHOLHDL 2.9 12/03/2020   Lab Results  Component Value Date   VD25OH 46.5 08/10/2016   Lab Results  Component Value Date   WBC 9.3 12/08/2021   HGB 14.2 12/08/2021   HCT 42.6 12/08/2021  MCV 97.5 12/08/2021   PLT 263 12/08/2021   Lab Results  Component Value Date   IRON 82 09/05/2022   TIBC 396 09/05/2022   FERRITIN 55 09/05/2022    Attestation Statements:   Reviewed by clinician on day of visit: allergies, medications, problem list, medical history, surgical history, family history, social history, and previous encounter notes.  I, Dawn Whitmire, FNP-C, am acting as transcriptionist for Dr. Jearld Lesch.  I have reviewed the above documentation for accuracy and  completeness, and I agree with the above. Jearld Lesch, DO

## 2023-01-17 ENCOUNTER — Telehealth (INDEPENDENT_AMBULATORY_CARE_PROVIDER_SITE_OTHER): Payer: No Typology Code available for payment source | Admitting: Psychology

## 2023-01-17 DIAGNOSIS — F419 Anxiety disorder, unspecified: Secondary | ICD-10-CM

## 2023-01-17 DIAGNOSIS — F32A Depression, unspecified: Secondary | ICD-10-CM | POA: Diagnosis not present

## 2023-01-17 DIAGNOSIS — F5089 Other specified eating disorder: Secondary | ICD-10-CM

## 2023-01-17 DIAGNOSIS — F909 Attention-deficit hyperactivity disorder, unspecified type: Secondary | ICD-10-CM

## 2023-01-17 NOTE — Progress Notes (Signed)
  Office: [phone]  /  Fax: [phone]    Date: January 17, 2023    Appointment Start Time: 1:59pm Duration: 31 minutes Provider: Glennie Isle, Psy.D. Type of Session: Individual Therapy  Location of Patient: Home (private location) Location of Provider: Provider's Home (private office) Type of Contact: Telepsychological Visit via MyChart Video Visit  Session Content: Taylor [name] is a 42 y.o. female presenting for a follow-up appointment to address the previously established treatment goal of increasing coping skills.Today's appointment was a telepsychological visit. Taylor [name] provided verbal consent for today's telepsychological appointment and she is aware she is responsible for securing confidentiality on her end of the session. Prior to proceeding with today's appointment, Taylor [name]'s physical location at the time of this appointment was obtained as well a phone number she could be reached at in the event of technical difficulties. Taylor [name] and this provider participated in today's telepsychological service. Of note, today's appointment was switched to a regular telephone call at 2:02pm with Taylor [name]'s verbal consent due to technical issues.   This provider conducted a brief check-in. Taylor [name] shared she is "monitoring" her "hunger cues more." Further explored and processed. Psychoeducation provided regarding the importance of eating regularly, including its impact on binge eating behaviors. She was engaged in problem solving to assist with eating regularly and congruent to her structured meal plan (e.g., develop a master grocery list; double recipes; pre-portion snacks; have snacks at the desk). Overall, Taylor [name] was receptive to today's appointment as evidenced by openness to sharing, responsiveness to feedback, and willingness to implement discussed strategies .  Mental Status Examination:  Appearance: neat Behavior: appropriate to circumstances Mood: neutral Affect: mood congruent Speech:  WNL Eye Contact: appropriate Psychomotor Activity: WNL Gait: unable to assess Thought Process: linear, logical, and goal directed and no evidence or endorsement of suicidal, homicidal, and self-harm ideation, plan and intent  Thought Content/Perception: no hallucinations, delusions, bizarre thinking or behavior endorsed or observed Orientation: AAOx4 Memory/Concentration: memory, attention, language, and fund of knowledge intact  Insight: fair Judgment: fair  Interventions:  Conducted a brief chart review Provided empathic reflections and validation Reviewed content from the previous session Employed supportive psychotherapy interventions to facilitate reduced distress and to improve coping skills with identified stressors Engaged patient in problem solving  DSM-5 Diagnosis(es):  F50.89 Other Specified Feeding or Eating Disorder, Emotional and Binge Eating Behaviors, F90.9 Unspecified Attention-Deficit/Hyperactivity Disorder , F41.9 Unspecified Anxiety Disorder, and  F32.A Unspecified Depressive Disorder  Treatment Goal & Progress: During the initial appointment with this provider, the following treatment goal was established: increase coping skills. Taylor [name] has demonstrated progress in her goal as evidenced by increased awareness of hunger patterns.   Plan: The next appointment is scheduled for 01/31/2023 at 2:30pm, which will be via MyChart Video Visit. The next session will focus on working towards the established treatment goal. Taylor [name] will initiate therapeutic services with [facility] on 01/18/2023.

## 2023-01-18 ENCOUNTER — Ambulatory Visit: Payer: No Typology Code available for payment source | Admitting: Psychology

## 2023-01-18 NOTE — Progress Notes (Signed)
Chief Complaint:   OBESITY Taylor [name] is here to discuss her progress with her obesity treatment plan along with follow-up of her obesity related diagnoses. Taylor [name] is on the Category 2 plan and states she is following her eating plan approximately 80% of the time. Taylor [name] states she is weight training for 20 minutes 2-3 times per week.  Today's visit was #: 5 Starting weight: 196 lbs Starting date: 10/19/22 Today's weight: 204 lbs Today's date: 01/09/23 Total lbs lost to date: 0 Total lbs lost since last in-office visit: +1  Interim History: She is up 1 pound since her last visit.  She states that she just started the naltrexone today (prescription written at last visit).  She needs dental work and cannot eat on one side of her mouth.  Subjective:   1. PCOS (polycystic ovarian syndrome) No medications.  2. Binge eating Started naltrexone today.  Taking Wellbutrin per another provider.  Had taken Vyvanse in the past.  Assessment/Plan:   1. PCOS (polycystic ovarian syndrome) 1.  Will keep all carbohydrates low (sugar and starches).  2. Binge eating 1.  Continue naltrexone. 2.  Will identify true source of emotional/stress eating. 3.  Seeing Dr. Mallie Mussel for emotional/stress eating and will continue.  3. Generalized obesity BMI 33.0-33.9,adult 1.  Meal planning. 2.  Intentional eating. 3.  Increase protein and water intake.  Taylor [name] is currently in the action stage of change. As such, her goal is to continue with weight loss efforts. She has agreed to the Category 2 plan and the pescatarian plan.  Exercise goals: Started weight training.  Continue same.  Behavioral modification strategies: increasing lean protein intake, decreasing simple carbohydrates, increasing vegetables, increasing water intake, decreasing eating out, no skipping meals, meal planning and cooking strategies, keeping healthy foods in the home, and planning for success.  Taylor [name] has agreed to follow-up  with our clinic in 2-3 weeks with Taylor [name]. She was informed of the importance of frequent follow-up visits to maximize her success with intensive lifestyle modifications for her multiple health conditions.   Objective:   Blood pressure (!) 145/84, pulse 90, temperature 97.9 F (36.6 C), height 5\' 5"  (1.651 m), weight 204 lb (92.5 kg), SpO2 97 %. Body mass index is 33.95 kg/m.  General: Cooperative, alert, well developed, in no acute distress. HEENT: Conjunctivae and lids unremarkable. Cardiovascular: Regular rhythm.  Lungs: Normal work of breathing. Neurologic: No focal deficits.   Lab Results  Component Value Date   CREATININE 0.88 10/19/2022   BUN 12 10/19/2022   NA 137 10/19/2022   K 4.7 10/19/2022   CL 106 10/19/2022   CO2 16 (L) 10/19/2022   Lab Results  Component Value Date   ALT 14 10/19/2022   AST 18 10/19/2022   ALKPHOS 73 10/19/2022   BILITOT 0.2 10/19/2022   Lab Results  Component Value Date   HGBA1C 5.5 09/05/2022   HGBA1C 5.0 07/26/2021   HGBA1C 5.5 12/03/2020   HGBA1C 5.2 08/06/2019   HGBA1C 5.6 08/08/2017   Lab Results  Component Value Date   INSULIN 10.6 10/19/2022   INSULIN 14.3 08/10/2016   Lab Results  Component Value Date   TSH 1.24 09/05/2022   Lab Results  Component Value Date   CHOL 180 12/03/2020   HDL 62 12/03/2020   LDLCALC 99 12/03/2020   TRIG 94 12/03/2020   CHOLHDL 2.9 12/03/2020   Lab Results  Component Value Date   VD25OH 46.5 08/10/2016   Lab Results  Component Value Date  WBC 9.3 12/08/2021   HGB 14.2 12/08/2021   HCT 42.6 12/08/2021   MCV 97.5 12/08/2021   PLT 263 12/08/2021   Lab Results  Component Value Date   IRON 82 09/05/2022   TIBC 396 09/05/2022   FERRITIN 55 09/05/2022    Attestation Statements:   Reviewed by clinician on day of visit: allergies, medications, problem list, medical history, surgical history, family history, social history, and previous encounter notes.  I, Dawn Whitmire, FNP-C,  am acting as transcriptionist for Dr. Jearld Lesch.  I have reviewed the above documentation for accuracy and completeness, and I agree with the above. Jearld Lesch, DO

## 2023-01-26 ENCOUNTER — Ambulatory Visit: Payer: No Typology Code available for payment source | Admitting: Nurse Practitioner

## 2023-01-31 ENCOUNTER — Telehealth (INDEPENDENT_AMBULATORY_CARE_PROVIDER_SITE_OTHER): Payer: No Typology Code available for payment source | Admitting: Psychology

## 2023-02-01 ENCOUNTER — Other Ambulatory Visit: Payer: Self-pay | Admitting: Family Medicine

## 2023-02-01 DIAGNOSIS — F341 Dysthymic disorder: Secondary | ICD-10-CM

## 2023-02-01 DIAGNOSIS — F411 Generalized anxiety disorder: Secondary | ICD-10-CM

## 2023-02-14 ENCOUNTER — Ambulatory Visit: Payer: No Typology Code available for payment source | Admitting: Psychology

## 2023-02-14 ENCOUNTER — Telehealth (INDEPENDENT_AMBULATORY_CARE_PROVIDER_SITE_OTHER): Payer: Self-pay | Admitting: Psychology

## 2023-02-15 ENCOUNTER — Ambulatory Visit: Payer: No Typology Code available for payment source | Admitting: Bariatrics

## 2023-02-21 ENCOUNTER — Telehealth (INDEPENDENT_AMBULATORY_CARE_PROVIDER_SITE_OTHER): Payer: Self-pay | Admitting: Psychology

## 2023-03-06 ENCOUNTER — Ambulatory Visit: Payer: No Typology Code available for payment source | Admitting: Family Medicine

## 2023-03-31 ENCOUNTER — Ambulatory Visit (INDEPENDENT_AMBULATORY_CARE_PROVIDER_SITE_OTHER): Payer: No Typology Code available for payment source | Admitting: Family Medicine

## 2023-03-31 ENCOUNTER — Encounter: Payer: Self-pay | Admitting: Family Medicine

## 2023-03-31 VITALS — BP 136/78 | HR 96 | Ht 65.0 in | Wt 206.0 lb

## 2023-03-31 DIAGNOSIS — R7301 Impaired fasting glucose: Secondary | ICD-10-CM

## 2023-03-31 DIAGNOSIS — R61 Generalized hyperhidrosis: Secondary | ICD-10-CM

## 2023-03-31 DIAGNOSIS — F988 Other specified behavioral and emotional disorders with onset usually occurring in childhood and adolescence: Secondary | ICD-10-CM | POA: Diagnosis not present

## 2023-03-31 DIAGNOSIS — E669 Obesity, unspecified: Secondary | ICD-10-CM

## 2023-03-31 DIAGNOSIS — F411 Generalized anxiety disorder: Secondary | ICD-10-CM | POA: Diagnosis not present

## 2023-03-31 DIAGNOSIS — F341 Dysthymic disorder: Secondary | ICD-10-CM

## 2023-03-31 LAB — POCT GLYCOSYLATED HEMOGLOBIN (HGB A1C): Hemoglobin A1C: 5.2 % (ref 4.0–5.6)

## 2023-03-31 MED ORDER — METHYLPHENIDATE HCL ER (OSM) 36 MG PO TBCR: 36.0000 mg | EXTENDED_RELEASE_TABLET | ORAL | 0 refills | Status: DC

## 2023-03-31 MED ORDER — CLONAZEPAM 1 MG PO TABS: ORAL_TABLET | ORAL | 0 refills | Status: DC

## 2023-03-31 NOTE — Assessment & Plan Note (Signed)
PHQ 9 score of 8, this is a stressful time of year.  Will RF her klonopin for PRN use only.

## 2023-03-31 NOTE — Assessment & Plan Note (Signed)
Will inc Concerta dose and F/U in 3 months.  If decides to go back down on the dose then ok to f/u in 60mo.

## 2023-03-31 NOTE — Patient Instructions (Signed)
Some of the over-the-counter products that can be helpful with sleep include Benadryl, Tylenol PM, Unisom, and melatonin.  If you decide to try the melatonin start with around 3 mg and then you can gradually increase the dose over couple of weeks depending on how you are doing.  The melatonin works better if you take it nightly.  There is also some beneficial data with valerian root capsules so that would be an option as well if you are looking for more herbal treatment.  Continue to work on sleep hygiene including going to bed at the same time, getting up at the same time, avoiding screens for an hour before bedtime, and having a relaxing cooling quiet environment.  Can also check out Ina Kick for sleep on YouTube

## 2023-03-31 NOTE — Assessment & Plan Note (Signed)
A!C down to 5.2 today. Awesome!!!

## 2023-03-31 NOTE — Progress Notes (Signed)
Established Patient Office Visit  Subjective   Patient ID: Taylor [name], female    DOB: 1980/12/26  Age: 42 y.o. MRN: [id number]  Chief Complaint  Patient presents with   ifg   mood   ADD    HPI  Impaired fasting glucose-no increased thirst or urination. No symptoms consistent with hypoglycemia.  ADD - Reports symptoms are well controlled on current regime. Denies any problems with insomnia, chest pain, palpitations, or SOB.  Switched from vyvanse to concerta.  She is doing OK on the concerta. Preferred the Vyvanse but it is still on backorder.    Thiamine was low back in the fall.  They have rechecked it with Dr. Corinna [name] in December and her levels were back up.  Still not sleeping well. Has been a long term issues but now have occ hotflashes and sweats at [facility].     ROS    Objective:     BP 136/78   Pulse 96   Ht 5\' 5"  (1.651 m)   Wt 206 lb (93.4 kg)   SpO2 100%   BMI 34.28 kg/m    Physical Exam Vitals and nursing note reviewed.  Constitutional:      Appearance: She is well-developed.  HENT:     Head: Normocephalic and atraumatic.  Cardiovascular:     Rate and Rhythm: Normal rate and regular rhythm.     Heart sounds: Normal heart sounds.  Pulmonary:     Effort: Pulmonary effort is normal.     Breath sounds: Normal breath sounds.  Skin:    General: Skin is warm and dry.  Neurological:     Mental Status: She is alert and oriented to person, place, and time.  Psychiatric:        Behavior: Behavior normal.      Results for orders placed or performed in visit on 03/31/23  POCT glycosylated hemoglobin (Hb A1C)  Result Value Ref Range   Hemoglobin A1C 5.2 4.0 - 5.6 %   HbA1c POC (<> result, manual entry)     HbA1c, POC (prediabetic range)     HbA1c, POC (controlled diabetic range)        The 10-year ASCVD risk score (Arnett DK, et al., 2019) is: 2%    Assessment & Plan:   Problem List Items Addressed This Visit       Endocrine    Impaired fasting glucose - Primary    A!C down to 5.2 today. Awesome!!!       Relevant Orders   POCT glycosylated hemoglobin (Hb A1C) (Completed)     Other   Generalized obesity    PHQ 9 score of 8, this is a stressful time of year.  Will RF her klonopin for PRN use only.       Relevant Medications   methylphenidate (CONCERTA) 36 MG PO CR tablet   methylphenidate (CONCERTA) 36 MG PO CR tablet (Start on 04/30/2023)   methylphenidate (CONCERTA) 36 MG PO CR tablet (Start on 05/29/2023)   Attention deficit disorder    Will inc Concerta dose and F/U in 3 months.  If decides to go back down on the dose then ok to f/u in 35mo.        ANXIETY DEPRESSION   Relevant Medications   clonazePAM (KLONOPIN) 1 MG tablet   Other Visit Diagnoses     GAD (generalized anxiety disorder)       Relevant Medications   clonazePAM (KLONOPIN) 1 MG tablet   Night sweat  Night sweats-she is still on birth control and having fairly regular periods she certainly could be perimenopausal that is always a possibility.  She is already tried to make her sleeping arrangement more cool environment running fans etc. and that has been helpful. Could consider progesterone for bedtime or non  Return in about 3 months (around 07/01/2023) for ADD.    Nani Gasser, MD

## 2023-07-04 ENCOUNTER — Ambulatory Visit (INDEPENDENT_AMBULATORY_CARE_PROVIDER_SITE_OTHER): Payer: No Typology Code available for payment source | Admitting: Family Medicine

## 2023-07-04 VITALS — BP 118/68 | HR 87 | Ht 65.0 in | Wt 200.0 lb

## 2023-07-04 DIAGNOSIS — F411 Generalized anxiety disorder: Secondary | ICD-10-CM | POA: Diagnosis not present

## 2023-07-04 DIAGNOSIS — F341 Dysthymic disorder: Secondary | ICD-10-CM

## 2023-07-04 DIAGNOSIS — Z23 Encounter for immunization: Secondary | ICD-10-CM | POA: Diagnosis not present

## 2023-07-04 DIAGNOSIS — F988 Other specified behavioral and emotional disorders with onset usually occurring in childhood and adolescence: Secondary | ICD-10-CM | POA: Diagnosis not present

## 2023-07-04 MED ORDER — CLONAZEPAM 1 MG PO TABS: ORAL_TABLET | ORAL | 0 refills | Status: DC

## 2023-07-04 MED ORDER — METHYLPHENIDATE HCL ER (OSM) 36 MG PO TBCR
36.0000 mg | EXTENDED_RELEASE_TABLET | ORAL | 0 refills | Status: DC
Start: 2023-09-01 — End: 2024-02-20

## 2023-07-04 MED ORDER — BUPROPION HCL ER (XL) 300 MG PO TB24
300.0000 mg | ORAL_TABLET | Freq: Every day | ORAL | 1 refills | Status: DC
Start: 2023-07-04 — End: 2024-02-19

## 2023-07-04 MED ORDER — METHYLPHENIDATE HCL ER (OSM) 36 MG PO TBCR
36.0000 mg | EXTENDED_RELEASE_TABLET | ORAL | 0 refills | Status: DC
Start: 2023-07-04 — End: 2024-02-20

## 2023-07-04 MED ORDER — METHYLPHENIDATE HCL ER (OSM) 36 MG PO TBCR
36.0000 mg | EXTENDED_RELEASE_TABLET | ORAL | 0 refills | Status: DC
Start: 2023-08-02 — End: 2024-02-20

## 2023-07-04 NOTE — Progress Notes (Signed)
Established Patient Office Visit  Subjective   Patient ID: Taylor [name], female    DOB: [birth year]  Age: 42 y.o. MRN: [id number]  Chief Complaint  Patient presents with   ADD    HPI ADD - Reports symptoms are well controlled on current regime. Denies any problems with insomnia, chest pain, palpitations, or SOB.  She says the bump up in the methylphenidate to 36 mg has been perfect.  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  She says in fact she is actually been sleeping a little bit better than normal she does struggle sometimes but that is not necessarily new.  Follow-up anxiety/depression-she feels like she is doing well with her current dose of bupropion and as needed Xanax.  She says right now she is only taking maybe 1 Xanax a week sometimes not at all.  Is not interested in making any changes in her medication.       ROS    Objective:     BP 118/68   Pulse 87   Ht 5\' 5"  (1.651 m)   Wt 200 lb (90.7 kg)   SpO2 99%   BMI 33.28 kg/m    Physical Exam Vitals and nursing note reviewed.  Constitutional:      Appearance: Normal appearance.  HENT:     Head: Normocephalic and atraumatic.  Eyes:     Conjunctiva/sclera: Conjunctivae normal.  Cardiovascular:     Rate and Rhythm: Normal rate and regular rhythm.  Pulmonary:     Effort: Pulmonary effort is normal.     Breath sounds: Normal breath sounds.  Skin:    General: Skin is warm and dry.  Neurological:     Mental Status: She is alert.  Psychiatric:        Mood and Affect: Mood normal.     No results found for any visits on 07/04/23.    The 10-year ASCVD risk  score (Arnett DK, et al., 2019) is: 1.5%    Assessment & Plan:   Problem List Items Addressed This Visit       Other   Attention deficit disorder - Primary    Doing great with new Concerta. F.U in 6 months.        Relevant Medications   methylphenidate (CONCERTA) 36 MG PO CR tablet   methylphenidate (CONCERTA) 36 MG PO CR tablet (Start on 08/02/2023)   methylphenidate (CONCERTA) 36 MG PO CR tablet (Start on 09/01/2023)   ANXIETY DEPRESSION    PHQ-9 score of 5 today and GAD-7 score of 5.  Will continue with current dose of Wellbutrin and as needed Xanax.  Continue to use sparingly.      Relevant Medications   clonazePAM (KLONOPIN) 1 MG tablet   buPROPion (WELLBUTRIN XL) 300 MG 24 hr tablet   Other Visit Diagnoses     Encounter for immunization       Relevant Orders   Flu vaccine trivalent PF, 6mos and older(Flulaval,Afluria,Fluarix,Fluzone) (Completed)   GAD (generalized anxiety disorder)       Relevant Medications   clonazePAM (KLONOPIN) 1 MG tablet   buPROPion [facility]  XL) 300 MG 24 hr tablet       Return in about 6 months (around 01/01/2024) for ADD and Mood, Pre-diabetes.    Nani Gasser, MD

## 2023-07-04 NOTE — Assessment & Plan Note (Signed)
PHQ-9 score of 5 today and GAD-7 score of 5.  Will continue with current dose of Wellbutrin and as needed Xanax.  Continue to use sparingly.

## 2023-07-04 NOTE — Assessment & Plan Note (Signed)
Doing great with new Concerta. F.U in 6 months.

## 2023-11-02 ENCOUNTER — Ambulatory Visit: Payer: BC Managed Care – PPO | Admitting: Medical-Surgical

## 2023-11-02 ENCOUNTER — Encounter: Payer: Self-pay | Admitting: Medical-Surgical

## 2023-11-02 VITALS — BP 147/80 | HR 106 | Temp 97.8°F | Resp 20 | Ht 65.0 in | Wt 199.3 lb

## 2023-11-02 DIAGNOSIS — J069 Acute upper respiratory infection, unspecified: Secondary | ICD-10-CM | POA: Diagnosis not present

## 2023-11-02 LAB — POCT RAPID STREP A (OFFICE): Rapid Strep A Screen: NEGATIVE

## 2023-11-02 LAB — POCT INFLUENZA A/B
Influenza A, POC: NEGATIVE
Influenza B, POC: NEGATIVE

## 2023-11-02 LAB — POC COVID19 BINAXNOW: SARS Coronavirus 2 Ag: NEGATIVE

## 2023-11-02 MED ORDER — FLUTICASONE PROPIONATE 50 MCG/ACT NA SUSP: 2.0000 | Freq: Every day | NASAL | 0 refills | Status: AC | PRN

## 2023-11-02 MED ORDER — ALBUTEROL SULFATE HFA 108 (90 BASE) MCG/ACT IN AERS: 2.0000 | INHALATION_SPRAY | Freq: Four times a day (QID) | RESPIRATORY_TRACT | 0 refills | Status: DC | PRN

## 2023-11-02 MED ORDER — GUAIFENESIN-CODEINE 100-10 MG/5ML PO SOLN
5.0000 mL | Freq: Three times a day (TID) | ORAL | 0 refills | Status: DC | PRN
Start: 2023-11-02 — End: 2023-11-08

## 2023-11-02 NOTE — Patient Instructions (Signed)
 Medications & Home Remedies for Upper Respiratory Illness   Aches/Pains, Fever, Headache OTC Acetaminophen (Tylenol) 500 mg tablets - take max 2 tablets (1000 mg) every 6 hours (4 times per day)  OTC Ibuprofen (Motrin) 200 mg tablets - take max 4 tablets (800 mg) every 6 hours*   Sinus Congestion Prescription Atrovent as directed OTC Nasal Saline if desired to rinse OTC Oxymetolazone (Afrin, others) sparing use due to rebound congestion, NEVER use in kids OTC Phenylephrine (Sudafed) 10 mg tablets every 4 hours (or the 12-hour formulation)* OTC Diphenhydramine (Benadryl) 25 mg tablets - take max 2 tablets every 4 hours   Cough & Sore Throat Prescription cough pills or syrups as directed OTC Dextromethorphan (Robitussin, others) - cough suppressant OTC Guaifenesin (Robitussin, Mucinex, others) - expectorant (helps cough up mucus) (Dextromethorphan and Guaifenesin also come in a combination tablet/syrup) OTC Lozenges w/ Benzocaine + Menthol (Cepacol) Honey - as much as you want! Teas which "coat the throat" - look for ingredients Elm Bark, Licorice Root, Marshmallow Root   Other Prescription Oral Steroids to decrease inflammation and improve energy Prescription Antibiotics if these are necessary for bacterial infection - take ALL, even if you're feeling better  OTC Zinc Lozenges within 24 hours of symptoms onset - mixed evidence this shortens the duration of the common cold Don't waste your money on Vitamin C or Echinacea in acute illness - it's already too late!    *Caution in patients with high blood pressure

## 2023-11-02 NOTE — Progress Notes (Signed)
        Established patient visit  History, exam, impression, and plan:  1. Viral URI with cough (Primary) Pleasant 43 year old female presenting today with reports of 4 days of upper respiratory symptoms including sinus congestion, fatigue, cough productive of thick yellow mucus, mild shortness of breath, and chest discomfort.  Has also had a poor appetite, sore throat, and bodyaches.  Has been running a low-grade fever for the last several days.  Was recently exposed to her niece who was also sick with similar symptoms.  Her niece has gotten better in about 3 days however she feels that her symptoms are not improving at all.  Has tried plain Advil which helps with fever and bodyaches but no other interventions.  On exam, she does have bilateral middle ear effusions but no evidence of acute otitis media.  Maxillary facial pressure but not pain.  Mild posterior oropharyngeal erythema.  Mild submandibular lymphadenopathy.  Lungs are clear to auscultation with even and unlabored respirations.  S1/S2 normal, mildly tachycardic.  POCT flu, COVID, and strep testing all negative today.  Discussed the expected timeline for symptom resolution with viral upper respiratory infections.  It is likely that she will experience symptoms for 7-14 days.  For now we will do symptomatic treatment.  List of upper respiratory infection medications provided with AVS.  Restart Flonase  2 sprays each nostril daily.  Adding an albuterol  inhaler for shortness of breath and chest tightness.  Robitussin AC for nighttime cough management.   Procedures performed this visit: None.  Return if symptoms worsen or fail to improve.  __________________________________ Zada FREDRIK Palin, DNP, APRN, FNP-BC Primary Care and Sports Medicine [facility]

## 2023-11-06 ENCOUNTER — Encounter: Payer: Self-pay | Admitting: Medical-Surgical

## 2023-11-06 DIAGNOSIS — J329 Chronic sinusitis, unspecified: Secondary | ICD-10-CM

## 2023-11-06 MED ORDER — AMOXICILLIN-POT CLAVULANATE 875-125 MG PO TABS
1.0000 | ORAL_TABLET | Freq: Two times a day (BID) | ORAL | 0 refills | Status: DC
Start: 2023-11-06 — End: 2024-01-30

## 2023-11-06 MED ORDER — METHYLPREDNISOLONE 4 MG PO TBPK: ORAL_TABLET | ORAL | 0 refills | Status: DC

## 2023-11-07 ENCOUNTER — Telehealth: Payer: Self-pay

## 2023-11-07 NOTE — Telephone Encounter (Signed)
 Copied from CRM [id number]. Topic: Clinical - Medical Advice >> Nov 06, 2023  9:25 AM Joesph PARAS wrote: Reason for CRM: Patient calling to state she is still not feeling better from previous appointment, is requesting a differing prescription per Joy Jessup's advice.

## 2023-11-08 ENCOUNTER — Other Ambulatory Visit: Payer: Self-pay | Admitting: Medical-Surgical

## 2023-11-08 MED ORDER — GUAIFENESIN-CODEINE 100-10 MG/5ML PO SOLN: 5.0000 mL | Freq: Three times a day (TID) | ORAL | 0 refills | Status: DC | PRN

## 2023-11-08 NOTE — Addendum Note (Signed)
 Addended byChristen Butter on: 11/08/2023 09:19 AM   Modules accepted: Orders

## 2023-11-24 ENCOUNTER — Other Ambulatory Visit: Payer: Self-pay | Admitting: Medical-Surgical

## 2023-12-30 ENCOUNTER — Telehealth: Payer: Self-pay

## 2023-12-30 NOTE — Telephone Encounter (Signed)
 Prior auth for: METHYLPHENIDATE 36 MG Determination: DENIED Auth #: [id number] Valid from: N/A

## 2024-01-01 ENCOUNTER — Ambulatory Visit: Payer: No Typology Code available for payment source | Admitting: Family Medicine

## 2024-01-30 ENCOUNTER — Ambulatory Visit

## 2024-01-30 ENCOUNTER — Ambulatory Visit: Admitting: Physician Assistant

## 2024-01-30 ENCOUNTER — Encounter: Payer: Self-pay | Admitting: Physician Assistant

## 2024-01-30 ENCOUNTER — Ambulatory Visit: Payer: Self-pay

## 2024-01-30 VITALS — BP 124/83 | HR 88 | Ht 65.0 in | Wt 206.8 lb

## 2024-01-30 DIAGNOSIS — M549 Dorsalgia, unspecified: Secondary | ICD-10-CM | POA: Insufficient documentation

## 2024-01-30 DIAGNOSIS — M5412 Radiculopathy, cervical region: Secondary | ICD-10-CM

## 2024-01-30 MED ORDER — PREDNISONE 50 MG PO TABS
ORAL_TABLET | ORAL | 0 refills | Status: DC
Start: 2024-01-30 — End: 2024-02-20

## 2024-01-30 MED ORDER — CYCLOBENZAPRINE HCL 10 MG PO TABS
10.0000 mg | ORAL_TABLET | Freq: Three times a day (TID) | ORAL | 0 refills | Status: DC | PRN
Start: 2024-01-30 — End: 2024-05-08

## 2024-01-30 NOTE — Patient Instructions (Signed)
 Prednisone for 5 days and flexeril as needed.  Get xrays today.  Will call with PT

## 2024-01-30 NOTE — Telephone Encounter (Signed)
 Copied from CRM [id number]. Topic: Clinical - Red Word Triage >> Jan 30, 2024  8:39 AM Irine Seal wrote: [facility] that prompted transfer to Nurse Triage: Patient called requesting an earlier appointment than 04/22, preferably in the afternoon. Patient is experiencing severe upper back pain around the shoulder blade that has been present for a week. Reports numbness in the right arm and that nothing is relieving the pain. Requests a provider who specializes in muscle pain or back issues.  Chief Complaint: upper back pain by shoulder blades Symptoms: pain, numbness, tingling Frequency: comes and goes Pertinent Negatives: Patient denies bowel and bladder problems, fever, cp, sob Disposition: [] ED /[] Urgent Care (no appt availability in office) / [x] Appointment(In office/virtual)/ []  [place] [facility] [facility]/ [] Home Care/ [] Refused Recommended Disposition /[] [facility]/ []  Follow-up with PCP Additional Notes: per protocol apt made for today; care advice given, denies questions; instructed to go to ER if becomes worse.   Reason for Disposition  [1] SEVERE back pain (e.g., excruciating, unable to do any normal activities) AND [2] not improved 2 hours after pain medicine  Answer Assessment - Initial Assessment Questions 1. ONSET: "When did the pain begin?"      A week ago 2. LOCATION: "Where does it hurt?" (upper, mid or lower back)     Upper back; by shoulder blades 3. SEVERITY: "How bad is the pain?"  (e.g., Scale 1-10; mild, moderate, or severe)   - MILD (1-3): Doesn't interfere with normal activities.    - MODERATE (4-7): Interferes with normal activities or awakens from sleep.    - SEVERE (8-10): Excruciating pain, unable to do any normal activities.      7-8/10 4. PATTERN: "Is the pain constant?" (e.g., yes, no; constant, intermittent)      Comes and goes but constant most of the time 5. RADIATION: "Does the pain shoot into your legs or somewhere else?"     Right arm, numbness and  tingling 6. CAUSE:  "What do you think is causing the back pain?"      Side sleeper, maybe pulled muscle 7. BACK OVERUSE:  "Any recent lifting of heavy objects, strenuous work or exercise?"     na 8. MEDICINES: "What have you taken so far for the pain?" (e.g., nothing, acetaminophen, NSAIDS)     Yes, doesn't work now 9. NEUROLOGIC SYMPTOMS: "Do you have any weakness, numbness, or problems with bowel/bladder control?"     Numbness tingling to right arm and chest 10. OTHER SYMPTOMS: "Do you have any other symptoms?" (e.g., fever, abdomen pain, burning with urination, blood in urine)       denies 11. PREGNANCY: "Is there any chance you are pregnant?" "When was your last menstrual period?"       na  Protocols used: Back Pain-A-AH

## 2024-01-30 NOTE — Telephone Encounter (Signed)
 Patient scheduled today with Tandy Gaw, PA
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
## 2024-01-31 ENCOUNTER — Encounter: Payer: Self-pay | Admitting: Physician Assistant

## 2024-01-31 DIAGNOSIS — M5030 Other cervical disc degeneration, unspecified cervical region: Secondary | ICD-10-CM | POA: Insufficient documentation

## 2024-01-31 NOTE — Progress Notes (Signed)
 Normal xray of thoracic spine. No alignment issues.

## 2024-01-31 NOTE — Progress Notes (Signed)
 You do have degenerative changes of the cervical spine. Spurring at C4 through C-7 with appearance of neck in a spasm. All could cause the muscles on the right side to spasm and could be some disc involvement causing the pain down arm. Treatment plan stays the same for now.
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
## 2024-02-06 ENCOUNTER — Ambulatory Visit: Attending: Physician Assistant | Admitting: Rehabilitative and Restorative Service Providers"

## 2024-02-06 ENCOUNTER — Encounter: Payer: Self-pay | Admitting: Rehabilitative and Restorative Service Providers"

## 2024-02-06 ENCOUNTER — Other Ambulatory Visit: Payer: Self-pay

## 2024-02-06 DIAGNOSIS — M549 Dorsalgia, unspecified: Secondary | ICD-10-CM | POA: Diagnosis not present

## 2024-02-06 DIAGNOSIS — M546 Pain in thoracic spine: Secondary | ICD-10-CM | POA: Insufficient documentation

## 2024-02-06 DIAGNOSIS — R293 Abnormal posture: Secondary | ICD-10-CM | POA: Insufficient documentation

## 2024-02-06 DIAGNOSIS — R29898 Other symptoms and signs involving the musculoskeletal system: Secondary | ICD-10-CM | POA: Diagnosis present

## 2024-02-06 DIAGNOSIS — M5412 Radiculopathy, cervical region: Secondary | ICD-10-CM | POA: Insufficient documentation

## 2024-02-13 ENCOUNTER — Encounter: Payer: Self-pay | Admitting: Rehabilitative and Restorative Service Providers"

## 2024-02-13 ENCOUNTER — Ambulatory Visit: Admitting: Rehabilitative and Restorative Service Providers"

## 2024-02-13 DIAGNOSIS — R293 Abnormal posture: Secondary | ICD-10-CM

## 2024-02-13 DIAGNOSIS — M546 Pain in thoracic spine: Secondary | ICD-10-CM

## 2024-02-13 DIAGNOSIS — R29898 Other symptoms and signs involving the musculoskeletal system: Secondary | ICD-10-CM

## 2024-02-13 DIAGNOSIS — M5412 Radiculopathy, cervical region: Secondary | ICD-10-CM
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
## 2024-02-17 ENCOUNTER — Other Ambulatory Visit: Payer: Self-pay | Admitting: Family Medicine

## 2024-02-17 DIAGNOSIS — F341 Dysthymic disorder: Secondary | ICD-10-CM

## 2024-02-17 DIAGNOSIS — F411 Generalized anxiety disorder: Secondary | ICD-10-CM

## 2024-02-20 ENCOUNTER — Encounter: Payer: Self-pay | Admitting: Family Medicine

## 2024-02-20 ENCOUNTER — Ambulatory Visit: Admitting: Family Medicine

## 2024-02-20 ENCOUNTER — Ambulatory Visit: Admitting: Rehabilitative and Restorative Service Providers"

## 2024-02-20 ENCOUNTER — Telehealth: Payer: Self-pay | Admitting: Family Medicine

## 2024-02-20 VITALS — BP 126/82 | HR 101 | Ht 65.0 in | Wt 207.0 lb

## 2024-02-20 DIAGNOSIS — R7301 Impaired fasting glucose: Secondary | ICD-10-CM

## 2024-02-20 DIAGNOSIS — E611 Iron deficiency: Secondary | ICD-10-CM | POA: Diagnosis not present

## 2024-02-20 DIAGNOSIS — F900 Attention-deficit hyperactivity disorder, predominantly inattentive type: Secondary | ICD-10-CM | POA: Diagnosis not present

## 2024-02-20 DIAGNOSIS — F341 Dysthymic disorder: Secondary | ICD-10-CM | POA: Diagnosis not present

## 2024-02-20 NOTE — Assessment & Plan Note (Signed)
 In regards to mood okay to continue with Wellbutrin  daily and as needed clonazepam .  She was given a 30-day prescription 6 months ago and does not need refills today.  She feels like she is in a little bit of a funk right now but we did discuss that if that does not seem to ease in the next couple of weeks to please let me know as we may need to make a change to her regimen.

## 2024-02-20 NOTE — Progress Notes (Signed)
 Pt's insurance doesn't cover the Concerta  due to her age and no documentation of previous dosing of a stimulant medication prior to prescribing this one.

## 2024-02-20 NOTE — Telephone Encounter (Signed)
 Call patient and let her know that after she left I saw that she was due for some blood work and would like to get that updated I did put a cholesterol panel on there so if she could come 1 morning when she can fast but drink plenty of water.  Or if she needs to eat something light before coming in that is perfectly fine to.

## 2024-02-20 NOTE — Assessment & Plan Note (Signed)
 She did do an appeal and submitted her original testing from when she was about 43 years old to her insurance but they still declined the Concerta .  She said that there was a specific test that they wanted her to have done.  She is gena forward the paperwork to me so that I can see exactly what it was and see if it something we would be able to do or order it for her.  In the interim she could always use good Rx for the Concerta  if needed.  Right now she is just using it more as needed and still has some tabs left.

## 2024-02-20 NOTE — Assessment & Plan Note (Signed)
 Plan to check A1c with next labs.    Lab Results  Component Value Date   HGBA1C 5.2 03/31/2023

## 2024-02-20 NOTE — Progress Notes (Signed)
 Established Patient Office Visit  Subjective  Patient ID: Taylor [name], female    DOB: Feb 19, 1981  Age: 43 y.o. MRN: [id number]  Chief Complaint  Patient presents with   ADD   mood    HPI F/U ADD - insurance won't cover medication. DX age 29 after full day testing with a psychologist.  In fact she has a copy of the report and even submitted that to her insurance for an appeal but they still denied it and said that she had to have a very specific type of test she has been on some type of stimulant since she was 18.  Currently on Concerta  though she does not take it every day.  She has fortunately has a job that is pretty flexible and allows her to work in her own way.  Mood wise she does feel like she has been a little bit more down recently she feels like she is in a little bit of a funk.  PHQ-9 score of 13 and GAD-7 score of 6.  She is still on Wellbutrin  300 mg and tolerating it well.     ROS    Objective:     BP 126/82   Pulse (!) 101   Ht 5\' 5"  (1.651 m)   Wt 207 lb (93.9 kg)   SpO2 100%   BMI 34.45 kg/m    Physical Exam Vitals and nursing note reviewed.  Constitutional:      Appearance: Normal appearance.  HENT:     Head: Normocephalic and atraumatic.  Eyes:     Conjunctiva/sclera: Conjunctivae normal.  Cardiovascular:     Rate and Rhythm: Normal rate and regular rhythm.  Pulmonary:     Effort: Pulmonary effort is normal.     Breath sounds: Normal breath sounds.  Skin:    General: Skin is warm and dry.  Neurological:     Mental Status: She is alert.  Psychiatric:        Mood and Affect: Mood normal.      No results found for any visits on 02/20/24.    The ASCVD Risk score (Arnett DK, et al., 2019) failed to calculate for the following reasons:   Cannot find a previous HDL lab   Cannot find a previous total cholesterol lab    Assessment & Plan:   Problem List Items Addressed This Visit       Endocrine   Impaired fasting glucose - Primary    Plan to check A1c with next labs.    Lab Results  Component Value Date   HGBA1C 5.2 03/31/2023         Relevant Orders   CMP14+EGFR   Lipid panel   CBC   Hemoglobin A1c   Fe+TIBC+Fer     Other   Iron deficiency   Relevant Orders   CMP14+EGFR   Lipid panel   CBC   Hemoglobin A1c   Fe+TIBC+Fer   Attention deficit disorder   She did do an appeal and submitted her original testing from when she was about 43 years old to her insurance but they still declined the Concerta .  She said that there was a specific test that they wanted her to have done.  She is gena forward the paperwork to me so that I can see exactly what it was and see if it something we would be able to do or order it for her.  In the interim she could always use good Rx for the Concerta  if needed.  Right now she is just using it more as needed and still has some tabs left.      ANXIETY DEPRESSION   In regards to mood okay to continue with Wellbutrin  daily and as needed clonazepam .  She was given a 30-day prescription 6 months ago and does not need refills today.  She feels like she is in a little bit of a funk right now but we did discuss that if that does not seem to ease in the next couple of weeks to please let me know as we may need to make a change to her regimen.       Return in about 6 months (around 08/21/2024) for ADD.    Duaine German, MD

## 2024-02-21 NOTE — Telephone Encounter (Signed)
 Patient informed and will come in for fasting lab work

## 2024-02-22 ENCOUNTER — Ambulatory Visit: Admitting: Rehabilitative and Restorative Service Providers"

## 2024-02-22 ENCOUNTER — Encounter: Payer: Self-pay | Admitting: Family Medicine

## 2024-02-22 ENCOUNTER — Encounter: Payer: Self-pay | Admitting: Rehabilitative and Restorative Service Providers"

## 2024-02-22 DIAGNOSIS — M5412 Radiculopathy, cervical region: Secondary | ICD-10-CM | POA: Diagnosis not present

## 2024-02-22 DIAGNOSIS — R29898 Other symptoms and signs involving the musculoskeletal system: Secondary | ICD-10-CM

## 2024-02-22 DIAGNOSIS — M546 Pain in thoracic spine: Secondary | ICD-10-CM

## 2024-02-22 DIAGNOSIS — R293 Abnormal posture: Secondary | ICD-10-CM
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
## 2024-03-05 ENCOUNTER — Other Ambulatory Visit: Payer: Self-pay | Admitting: Family Medicine

## 2024-03-05 DIAGNOSIS — F411 Generalized anxiety disorder: Secondary | ICD-10-CM

## 2024-03-05 DIAGNOSIS — F341 Dysthymic disorder: Secondary | ICD-10-CM

## 2024-03-06 NOTE — Telephone Encounter (Signed)
 Checked database. Last refill was 07/04/2023   Next appt 08/21/2024

## 2024-05-05 ENCOUNTER — Other Ambulatory Visit: Payer: Self-pay | Admitting: Physician Assistant

## 2024-05-05 DIAGNOSIS — M549 Dorsalgia, unspecified: Secondary | ICD-10-CM

## 2024-05-05 DIAGNOSIS — M5412 Radiculopathy, cervical region: Secondary | ICD-10-CM

## 2024-07-02 ENCOUNTER — Encounter: Payer: Self-pay | Admitting: Sports Medicine

## 2024-07-16 ENCOUNTER — Encounter: Payer: Self-pay | Admitting: Physician Assistant

## 2024-07-16 DIAGNOSIS — M549 Dorsalgia, unspecified: Secondary | ICD-10-CM

## 2024-07-16 DIAGNOSIS — M5412 Radiculopathy, cervical region: Secondary | ICD-10-CM
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
## 2024-07-16 NOTE — Telephone Encounter (Signed)
 Pended referral

## 2024-07-16 NOTE — Telephone Encounter (Signed)
 Routing to PCP

## 2024-07-17 ENCOUNTER — Ambulatory Visit: Attending: Family Medicine | Admitting: Rehabilitative and Restorative Service Providers"

## 2024-07-17 ENCOUNTER — Encounter: Payer: Self-pay | Admitting: Rehabilitative and Restorative Service Providers"

## 2024-07-17 ENCOUNTER — Other Ambulatory Visit: Payer: Self-pay

## 2024-07-17 DIAGNOSIS — M546 Pain in thoracic spine: Secondary | ICD-10-CM | POA: Diagnosis present

## 2024-07-17 DIAGNOSIS — R293 Abnormal posture: Secondary | ICD-10-CM | POA: Diagnosis present

## 2024-07-17 DIAGNOSIS — R29898 Other symptoms and signs involving the musculoskeletal system: Secondary | ICD-10-CM | POA: Insufficient documentation

## 2024-07-17 DIAGNOSIS — M5412 Radiculopathy, cervical region: Secondary | ICD-10-CM | POA: Insufficient documentation

## 2024-07-17 DIAGNOSIS — M549 Dorsalgia, unspecified: Secondary | ICD-10-CM | POA: Diagnosis not present

## 2024-07-22 ENCOUNTER — Ambulatory Visit

## 2024-07-22 DIAGNOSIS — M546 Pain in thoracic spine: Secondary | ICD-10-CM

## 2024-07-22 DIAGNOSIS — R29898 Other symptoms and signs involving the musculoskeletal system: Secondary | ICD-10-CM

## 2024-07-22 DIAGNOSIS — M5412 Radiculopathy, cervical region: Secondary | ICD-10-CM

## 2024-07-22 DIAGNOSIS — R293 Abnormal posture: Secondary | ICD-10-CM
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
## 2024-07-29 ENCOUNTER — Ambulatory Visit: Admitting: Rehabilitative and Restorative Service Providers"

## 2024-07-29 ENCOUNTER — Encounter: Payer: Self-pay | Admitting: Rehabilitative and Restorative Service Providers"

## 2024-07-29 DIAGNOSIS — M546 Pain in thoracic spine: Secondary | ICD-10-CM

## 2024-07-29 DIAGNOSIS — R29898 Other symptoms and signs involving the musculoskeletal system: Secondary | ICD-10-CM

## 2024-07-29 DIAGNOSIS — M5412 Radiculopathy, cervical region: Secondary | ICD-10-CM

## 2024-07-29 DIAGNOSIS — R293 Abnormal posture: Secondary | ICD-10-CM
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
## 2024-08-05 ENCOUNTER — Encounter: Payer: Self-pay | Admitting: Rehabilitative and Restorative Service Providers"

## 2024-08-05 ENCOUNTER — Ambulatory Visit: Attending: Family Medicine | Admitting: Rehabilitative and Restorative Service Providers"

## 2024-08-05 DIAGNOSIS — M5412 Radiculopathy, cervical region: Secondary | ICD-10-CM | POA: Insufficient documentation

## 2024-08-05 DIAGNOSIS — M546 Pain in thoracic spine: Secondary | ICD-10-CM | POA: Diagnosis present

## 2024-08-05 DIAGNOSIS — R293 Abnormal posture: Secondary | ICD-10-CM | POA: Diagnosis present

## 2024-08-05 DIAGNOSIS — R29898 Other symptoms and signs involving the musculoskeletal system: Secondary | ICD-10-CM | POA: Insufficient documentation
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
## 2024-08-12 ENCOUNTER — Ambulatory Visit

## 2024-08-19 ENCOUNTER — Ambulatory Visit: Admitting: Rehabilitative and Restorative Service Providers"

## 2024-08-19 ENCOUNTER — Encounter: Payer: Self-pay | Admitting: Rehabilitative and Restorative Service Providers"

## 2024-08-19 DIAGNOSIS — M546 Pain in thoracic spine: Secondary | ICD-10-CM

## 2024-08-19 DIAGNOSIS — M5412 Radiculopathy, cervical region: Secondary | ICD-10-CM

## 2024-08-19 DIAGNOSIS — R29898 Other symptoms and signs involving the musculoskeletal system: Secondary | ICD-10-CM

## 2024-08-19 DIAGNOSIS — R293 Abnormal posture: Secondary | ICD-10-CM
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
## 2024-08-21 ENCOUNTER — Encounter: Payer: Self-pay | Admitting: Family Medicine

## 2024-08-21 ENCOUNTER — Other Ambulatory Visit: Payer: Self-pay | Admitting: Family Medicine

## 2024-08-21 ENCOUNTER — Ambulatory Visit: Admitting: Family Medicine

## 2024-08-21 VITALS — BP 130/82 | HR 84 | Ht 65.0 in | Wt 210.1 lb

## 2024-08-21 DIAGNOSIS — M5412 Radiculopathy, cervical region: Secondary | ICD-10-CM | POA: Insufficient documentation

## 2024-08-21 DIAGNOSIS — F341 Dysthymic disorder: Secondary | ICD-10-CM | POA: Diagnosis not present

## 2024-08-21 DIAGNOSIS — F411 Generalized anxiety disorder: Secondary | ICD-10-CM

## 2024-08-21 DIAGNOSIS — F900 Attention-deficit hyperactivity disorder, predominantly inattentive type: Secondary | ICD-10-CM

## 2024-08-21 DIAGNOSIS — R7301 Impaired fasting glucose: Secondary | ICD-10-CM

## 2024-08-21 DIAGNOSIS — Z1211 Encounter for screening for malignant neoplasm of colon: Secondary | ICD-10-CM

## 2024-08-21 DIAGNOSIS — F988 Other specified behavioral and emotional disorders with onset usually occurring in childhood and adolescence: Secondary | ICD-10-CM

## 2024-08-21 DIAGNOSIS — M549 Dorsalgia, unspecified: Secondary | ICD-10-CM

## 2024-08-21 DIAGNOSIS — E282 Polycystic ovarian syndrome: Secondary | ICD-10-CM

## 2024-08-21 MED ORDER — METFORMIN HCL ER 500 MG PO TB24: 500.0000 mg | ORAL_TABLET | Freq: Every day | ORAL | 1 refills | Status: AC

## 2024-08-21 MED ORDER — LISDEXAMFETAMINE DIMESYLATE 20 MG PO CAPS: 20.0000 mg | ORAL_CAPSULE | Freq: Every day | ORAL | 0 refills | Status: AC

## 2024-08-21 MED ORDER — METAXALONE 800 MG PO TABS: 800.0000 mg | ORAL_TABLET | Freq: Two times a day (BID) | ORAL | 1 refills | Status: AC | PRN

## 2024-08-21 NOTE — Progress Notes (Signed)
 Established Patient Office Visit  Subjective  Patient ID: Taylor [name], female    DOB: February 08, 1981  Age: 43 y.o. MRN: [id number]  Chief Complaint  Patient presents with   ifg   ADD    HPI  Discussed the use of AI scribe software for clinical note transcription with the patient, who gave verbal consent to proceed.  History of Present Illness Taylor [name] is a 43 year old female with ADHD and PCOS who presents for medication management and follow-up on neck pain.  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 - Uses clonazepam  sparingly, primarily for insomnia related to a busy mind  Cervical radiculopathy and myalgia - Neck pain with numbness radiating to right fingers - Underwent x-rays and physical therapy, with initial improvement but slower resolution of symptoms this time - Performs physical therapy exercises at her desk - Takes Flexeril  occasionally, but experiences worsened symptoms upon waking after use - Uses Advil as needed for pain relief  Polycystic ovary syndrome (pcos) and insulin  resistance - Struggling with weight loss despite adherence to a low-carb, low-calorie diet and regular exercise - History of metformin  use, which previously improved weight loss and insulin  resistance - Interested in restarting metformin   Preventive health maintenance - Mammogram scheduled for late November - Considering colon cancer screening options, including Cologuard, due to updated  guidelines for screening starting at age 28   ROS    Objective:     BP 130/82   Pulse 84   Ht 5' 5 (1.651 m)   Wt 210 lb 1.3 oz (95.3 kg)   SpO2 97%   BMI 34.96 kg/m     Physical Exam Vitals and nursing note reviewed.  Constitutional:      Appearance: Normal appearance.  HENT:     Head: Normocephalic and atraumatic.  Eyes:     Conjunctiva/sclera: Conjunctivae normal.  Cardiovascular:     Rate and Rhythm: Normal rate and regular rhythm.  Pulmonary:     Effort: Pulmonary effort is normal.     Breath sounds: Normal breath sounds.  Skin:    General: Skin is warm and dry.  Neurological:     Mental Status: She is alert.  Psychiatric:        Mood and Affect: Mood normal.      No results found for any visits on 08/21/24.     The ASCVD Risk score (Arnett DK, et al., 2019) failed to calculate for the following reasons:   Cannot find a previous HDL lab   Cannot find a previous total cholesterol lab    Assessment & Plan:   Problem List Items Addressed This Visit       Endocrine   PCOS (polycystic ovarian syndrome)   Polycystic ovary syndrome with insulin  resistance Struggling with weight loss despite diet and exercise. Metformin  previously aided weight loss and reduced insulin  resistance. - Prescribe metformin .      Relevant Medications   metFORMIN  (GLUCOPHAGE -XR) 500 MG 24 hr tablet   Impaired fasting glucose   Will get  updated A1C today         Nervous and Auditory   Right cervical radiculopathy   Cervical radiculopathy with right upper extremity symptoms Neck pain with numbness in right fingers persists. Physical therapy initially helped but progress is slower. Flexeril  exacerbated symptoms and caused grogginess. Lack of prednisone  may contribute to slower improvement. Uses Advil for pain relief. - Discontinue Flexeril . - Prescribe Skelaxin. - Consider Zanaflex if Skelaxin is not effective. - Continue physical therapy. - Use Advil as needed for  pain.      Relevant Medications   lisdexamfetamine (VYVANSE ) 20 MG capsule (Start on 10/16/2024)   lisdexamfetamine (VYVANSE ) 20 MG capsule (Start on 09/18/2024)   lisdexamfetamine (VYVANSE ) 20 MG capsule   metaxalone (SKELAXIN) 800 MG tablet     Other   Upper back pain on right side   Relevant Medications   metaxalone (SKELAXIN) 800 MG tablet   Attention deficit disorder   Attention-deficit hyperactivity disorder, predominantly inattentive type She preferred Vyvanse  over Concerta  for better efficacy and availability. The adult ADHD self-report scale indicated significant symptoms. - Discontinue Concerta . - Prescribe Vyvanse  20 mg.      Relevant Medications   lisdexamfetamine (VYVANSE ) 20 MG capsule (Start on 10/16/2024)   lisdexamfetamine (VYVANSE ) 20 MG capsule (Start on 09/18/2024)   lisdexamfetamine (VYVANSE ) 20 MG capsule   ANXIETY DEPRESSION - Primary   Depression and anxiety Mood managed with Wellbutrin  300 mg daily. Increased fatigue attributed to seasonal changes. Clonazepam  used sparingly for anxiety and sleep. - Continue Wellbutrin  300 mg daily. - Continue clonazepam  as needed.      Other Visit Diagnoses       Attention deficit disorder (ADD) without hyperactivity       Relevant Medications   lisdexamfetamine (VYVANSE ) 20 MG capsule (Start on 10/16/2024)   lisdexamfetamine (VYVANSE ) 20 MG capsule (Start on 09/18/2024)   lisdexamfetamine (VYVANSE ) 20 MG capsule     Screen for colon cancer       Relevant Orders   Cologuard      Assessment and Plan Assessment & Plan  General Health Maintenance Mammogram scheduled with Dr. Dannielle for late November. Opted for Cologuard for colon cancer screening due to time constraints, with plans for a colonoscopy in three years. - Order Cologuard for colon cancer screening. - mammogram appointment with Dr. Dannielle is confirmed.    Return in about 6 months (around 02/19/2025) for ADD.    Dorothyann Byars, MD

## 2024-08-21 NOTE — Assessment & Plan Note (Signed)
Will get updated A1C today

## 2024-08-21 NOTE — Assessment & Plan Note (Signed)
 Attention-deficit hyperactivity disorder, predominantly inattentive type She preferred Vyvanse  over Concerta  for better efficacy and availability. The adult ADHD self-report scale indicated significant symptoms. - Discontinue Concerta . - Prescribe Vyvanse  20 mg.

## 2024-08-21 NOTE — Assessment & Plan Note (Signed)
 Cervical radiculopathy with right upper extremity symptoms Neck pain with numbness in right fingers persists. Physical therapy initially helped but progress is slower. Flexeril  exacerbated symptoms and caused grogginess. Lack of prednisone  may contribute to slower improvement. Uses Advil for pain relief. - Discontinue Flexeril . - Prescribe Skelaxin. - Consider Zanaflex if Skelaxin is not effective. - Continue physical therapy. - Use Advil as needed for pain.

## 2024-08-21 NOTE — Assessment & Plan Note (Signed)
 Depression and anxiety Mood managed with Wellbutrin  300 mg daily. Increased fatigue attributed to seasonal changes. Clonazepam  used sparingly for anxiety and sleep. - Continue Wellbutrin  300 mg daily. - Continue clonazepam  as needed.

## 2024-08-21 NOTE — Progress Notes (Signed)
 Sent request for mammogram.

## 2024-08-21 NOTE — Assessment & Plan Note (Signed)
 Polycystic ovary syndrome with insulin  resistance Struggling with weight loss despite diet and exercise. Metformin  previously aided weight loss and reduced insulin  resistance. - Prescribe metformin .

## 2024-08-22 ENCOUNTER — Ambulatory Visit: Payer: Self-pay | Admitting: Family Medicine

## 2024-08-22 LAB — LIPID PANEL
Chol/HDL Ratio: 2.9 ratio (ref 0.0–4.4)
Cholesterol, Total: 189 mg/dL (ref 100–199)
HDL: 65 mg/dL (ref >39)
LDL Chol Calc (NIH): 95 mg/dL (ref 0–99)
Triglycerides: 168 mg/dL — ABNORMAL HIGH (ref 0–149)
VLDL Cholesterol Cal: 29 mg/dL (ref 5–40)

## 2024-08-22 LAB — CMP14+EGFR
ALT: 18 IU/L (ref 0–32)
AST: 21 IU/L (ref 0–40)
Albumin: 4.5 g/dL (ref 3.9–4.9)
Alkaline Phosphatase: 87 IU/L (ref 41–116)
BUN/Creatinine Ratio: 14 (ref 9–23)
BUN: 12 mg/dL (ref 6–24)
Bilirubin Total: 0.3 mg/dL (ref 0.0–1.2)
CO2: 16 mmol/L — ABNORMAL LOW (ref 20–29)
Calcium: 9.8 mg/dL (ref 8.7–10.2)
Chloride: 103 mmol/L (ref 96–106)
Creatinine, Ser: 0.88 mg/dL (ref 0.57–1.00)
Globulin, Total: 2.9 g/dL (ref 1.5–4.5)
Glucose: 100 mg/dL — ABNORMAL HIGH (ref 70–99)
Potassium: 5 mmol/L (ref 3.5–5.2)
Sodium: 137 mmol/L (ref 134–144)
Total Protein: 7.4 g/dL (ref 6.0–8.5)
eGFR: 83 mL/min/1.73 (ref >59)

## 2024-08-22 LAB — IRON,TIBC AND FERRITIN PANEL
Ferritin: 117 ng/mL (ref 15–150)
Iron Saturation: 23 % (ref 15–55)
Iron: 93 ug/dL (ref 27–159)
Total Iron Binding Capacity: 409 ug/dL (ref 250–450)
UIBC: 316 ug/dL (ref 131–425)

## 2024-08-22 LAB — CBC
Hematocrit: 41.9 % (ref 34.0–46.6)
Hemoglobin: 13.7 g/dL (ref 11.1–15.9)
MCH: 31.5 pg (ref 26.6–33.0)
MCHC: 32.7 g/dL (ref 31.5–35.7)
MCV: 96 fL (ref 79–97)
Platelets: 302 x10E3/uL (ref 150–450)
RBC: 4.35 x10E6/uL (ref 3.77–5.28)
RDW: 11.8 % (ref 11.7–15.4)
WBC: 8.8 x10E3/uL (ref 3.4–10.8)

## 2024-08-22 LAB — HEMOGLOBIN A1C
Est. average glucose Bld gHb Est-mCnc: 117 mg/dL
Hgb A1c MFr Bld: 5.7 % — ABNORMAL HIGH (ref 4.8–5.6)

## 2024-08-22 NOTE — Progress Notes (Signed)
 Hi Eyana, triglycerides are up a little bit this time but LDL and cholesterol at goal.  Your A1c trended up compared to last year so it is at 5.7 which is technically in the prediabetes range just really want you to work on cutting back on sweets and carbs and increase your vegetable intake also encourage you to work on moderate intensity exercise for 30 minutes 5 days a week that helps improve blood sugar levels as well and lets plan to recheck that A1c again in 6 months to make sure that it is come back down.  Metabolic panel overall looks good.  Blood count is normal.  Iron level looks good.  Schedule your mammogram when you get a chance.

## 2024-08-23 ENCOUNTER — Telehealth: Payer: Self-pay

## 2024-08-23 ENCOUNTER — Other Ambulatory Visit (HOSPITAL_COMMUNITY): Payer: Self-pay

## 2024-08-23 NOTE — Telephone Encounter (Signed)
 Pharmacy Patient Advocate Encounter   Received notification from CoverMyMeds that prior authorization for Lisdexamfetamine 20mg  caps is required/requested.   Insurance verification completed.   The patient is insured through [employer].   Per test claim: PA required; PA started via CoverMyMeds. KEY [id number] . Waiting for clinical questions to populate.

## 2024-08-26 ENCOUNTER — Ambulatory Visit: Admitting: Rehabilitative and Restorative Service Providers"

## 2024-08-27 NOTE — Telephone Encounter (Signed)
 Clinical questions answered and PA submitted.

## 2024-09-02 ENCOUNTER — Ambulatory Visit: Attending: Family Medicine | Admitting: Rehabilitative and Restorative Service Providers"

## 2024-09-02 ENCOUNTER — Other Ambulatory Visit (HOSPITAL_COMMUNITY): Payer: Self-pay

## 2024-09-02 ENCOUNTER — Encounter: Payer: Self-pay | Admitting: Rehabilitative and Restorative Service Providers"

## 2024-09-02 DIAGNOSIS — M5412 Radiculopathy, cervical region: Secondary | ICD-10-CM | POA: Insufficient documentation

## 2024-09-02 DIAGNOSIS — R29898 Other symptoms and signs involving the musculoskeletal system: Secondary | ICD-10-CM | POA: Insufficient documentation

## 2024-09-02 DIAGNOSIS — M546 Pain in thoracic spine: Secondary | ICD-10-CM | POA: Insufficient documentation

## 2024-09-02 DIAGNOSIS — R293 Abnormal posture: Secondary | ICD-10-CM | POA: Diagnosis present
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
## 2024-09-03 NOTE — Telephone Encounter (Signed)
 Ok lets have her complete the Adult ADHD self report scale.

## 2024-09-03 NOTE — Telephone Encounter (Signed)
 Pharmacy Patient Advocate Encounter  Received notification from [employer] that Prior Authorization for Lisdexamfetamine 20mg  caps has been DENIED.  Full denial letter will be uploaded to the media tab. See denial reason below.   PA #/Case ID/Reference #: [id number]

## 2024-09-04 ENCOUNTER — Ambulatory Visit: Payer: Self-pay | Admitting: Family Medicine

## 2024-09-04 LAB — COLOGUARD: COLOGUARD: NEGATIVE

## 2024-09-04 NOTE — Progress Notes (Signed)
 Great news! Your Cologuard test is negative.  Recommend repeat colon cancer screening in 3 years.

## 2024-09-05 NOTE — Telephone Encounter (Signed)
 Spoke with patient.  Completed Adult ADHD self report scale  Documented in patient chart under specialty flowsheet. And hard copy placed in Dr. Vernida in - basket.  Patient did state during the call that she had sent her insurance company all of the notes from her psychiatrist who diagnosed her with ADHD but medication was still denied.

## 2024-09-05 NOTE — Telephone Encounter (Signed)
 Attempted call to patient to  do the ADHD self report scale over the phone.  Left a voice mail message requesting a return call.

## 2024-09-09 NOTE — Telephone Encounter (Signed)
 Okay lets reinitiate prior authorization they said that this form was one of the things that they needed that so I had her come by and fill it out so we can try again.

## 2024-09-11 ENCOUNTER — Ambulatory Visit: Admitting: Rehabilitative and Restorative Service Providers"

## 2024-09-11 ENCOUNTER — Encounter: Payer: Self-pay | Admitting: Rehabilitative and Restorative Service Providers"

## 2024-09-11 DIAGNOSIS — M5412 Radiculopathy, cervical region: Secondary | ICD-10-CM | POA: Diagnosis not present

## 2024-09-11 DIAGNOSIS — R293 Abnormal posture: Secondary | ICD-10-CM

## 2024-09-11 DIAGNOSIS — M546 Pain in thoracic spine: Secondary | ICD-10-CM

## 2024-09-11 DIAGNOSIS — R29898 Other symptoms and signs involving the musculoskeletal system: Secondary | ICD-10-CM
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
## 2024-09-13 ENCOUNTER — Other Ambulatory Visit (HOSPITAL_COMMUNITY): Payer: Self-pay

## 2024-09-16 ENCOUNTER — Ambulatory Visit: Admitting: Rehabilitative and Restorative Service Providers"

## 2024-09-16 ENCOUNTER — Encounter: Payer: Self-pay | Admitting: Rehabilitative and Restorative Service Providers"

## 2024-09-16 ENCOUNTER — Telehealth: Payer: Self-pay | Admitting: Pharmacist

## 2024-09-16 DIAGNOSIS — R293 Abnormal posture: Secondary | ICD-10-CM

## 2024-09-16 DIAGNOSIS — R29898 Other symptoms and signs involving the musculoskeletal system: Secondary | ICD-10-CM

## 2024-09-16 DIAGNOSIS — M5412 Radiculopathy, cervical region: Secondary | ICD-10-CM

## 2024-09-16 DIAGNOSIS — M546 Pain in thoracic spine: Secondary | ICD-10-CM

## 2024-09-16 NOTE — Telephone Encounter (Signed)
 An E-Appeal has been submitted. Will advise when response is received or follow up in 1 week. Please be advised that most companies may take 30 days to make a decision. Appeal letter and supporting documentation have been uploaded and submitted via [employer] website on 09/16/2024 @8 :55 am.  Thank you, Devere Pandy, PharmD Clinical Pharmacist  [place]  Direct Dial: [phone]
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
## 2024-09-20 ENCOUNTER — Other Ambulatory Visit (HOSPITAL_COMMUNITY): Payer: Self-pay

## 2024-09-23 ENCOUNTER — Ambulatory Visit: Admitting: Rehabilitative and Restorative Service Providers"

## 2024-09-30 ENCOUNTER — Other Ambulatory Visit: Payer: Self-pay | Admitting: Obstetrics & Gynecology

## 2024-09-30 DIAGNOSIS — R928 Other abnormal and inconclusive findings on diagnostic imaging of breast: Secondary | ICD-10-CM

## 2024-10-02 NOTE — Telephone Encounter (Signed)
 Pharmacy Patient Advocate Encounter  Received notification from [employer] that Prior Authorization appeal for Lisdexamfetamine 20mg  caps has been DENIED.  Full denial letter will be uploaded to the media tab. See denial reason below.

## 2024-10-03 MED ORDER — AMPHETAMINE-DEXTROAMPHET ER 15 MG PO CP24: 15.0000 mg | ORAL_CAPSULE | ORAL | 0 refills | Status: AC

## 2024-10-03 NOTE — Telephone Encounter (Signed)
 Please notify as below and let her know what they will cover and see what she wants to do. She can pay cash for her med too

## 2024-10-03 NOTE — Telephone Encounter (Signed)
Meds ordered this encounter  Medications   amphetamine-dextroamphetamine (ADDERALL XR) 15 MG 24 hr capsule    Sig: Take 1 capsule by mouth every morning.    Dispense:  30 capsule    Refill:  0

## 2024-10-03 NOTE — Telephone Encounter (Signed)
 Spoke with patient. She is agreeable to a trial of the Adderall XR . She states she thinks she used this about 20 years ago but is not sure if was IR Or XR version. She felt it wore off too quickly and was possibly a little too much of  a stimulant but does not remember entirely. She is willing to try this again. She did pick up the  lisdexamfetaine and paid for $60 for this month.

## 2024-10-03 NOTE — Addendum Note (Signed)
 Addended by: Jaylynne Birkhead D on: 10/03/2024 05:48 PM   Modules accepted: Orders

## 2024-10-04 ENCOUNTER — Other Ambulatory Visit (HOSPITAL_COMMUNITY): Payer: Self-pay

## 2024-10-04 ENCOUNTER — Telehealth: Payer: Self-pay

## 2024-10-04 NOTE — Telephone Encounter (Signed)
 Pharmacy Patient Advocate Encounter   Received notification from [employer]base that prior authorization for Amphetamine -Dextroamphet ER 15MG  er capsules is required/requested.   Insurance verification completed.   The patient is insured through [employer].   Per test claim: PA required; PA started via CoverMyMeds. KEY [id number] . Waiting for clinical questions to populate.

## 2024-10-04 NOTE — Telephone Encounter (Signed)
 Clinical questions have been answered and PA submitted. PA currently Pending.

## 2024-10-08 NOTE — Telephone Encounter (Signed)
 Additional information has been requested from the patient's insurance in order to proceed with the prior authorization request. Requested information has been sent, or form has been filled out and faxed back to [phone]

## 2024-10-09 ENCOUNTER — Inpatient Hospital Stay
Admission: RE | Admit: 2024-10-09 | Discharge: 2024-10-09 | Attending: Obstetrics & Gynecology | Admitting: Obstetrics & Gynecology

## 2024-10-09 ENCOUNTER — Other Ambulatory Visit (HOSPITAL_COMMUNITY): Payer: Self-pay

## 2024-10-09 DIAGNOSIS — R928 Other abnormal and inconclusive findings on diagnostic imaging of breast: Secondary | ICD-10-CM

## 2024-10-09 NOTE — Telephone Encounter (Signed)
 Pharmacy Patient Advocate Encounter  Received notification from [facility] that Prior Authorization for Amphetamine -Dextroamphet ER 15MG  er capsules has been APPROVED from 10/09/24 to 10/30/2038. Ran test claim, Copay is $10. This test claim was processed through [facility]- copay amounts may vary at other pharmacies due to pharmacy/plan contracts, or as the patient moves through the different stages of their insurance plan.   PA #/Case ID/Reference #: [id number]

## 2024-10-18 ENCOUNTER — Other Ambulatory Visit (HOSPITAL_COMMUNITY): Payer: Self-pay

## 2025-02-19 ENCOUNTER — Ambulatory Visit: Admitting: Family Medicine
